# Patient Record
Sex: Female | Born: 1935 | Race: White | Hispanic: No | Smoking: Never smoker
Health system: Southern US, Community
[De-identification: ages and names within clinical notes are randomized; demographics above are authoritative.]

## PROBLEM LIST (undated history)

## (undated) DIAGNOSIS — F0390 Unspecified dementia without behavioral disturbance: Secondary | ICD-10-CM

## (undated) DIAGNOSIS — I10 Essential (primary) hypertension: Secondary | ICD-10-CM

## (undated) DIAGNOSIS — E079 Disorder of thyroid, unspecified: Secondary | ICD-10-CM

## (undated) DIAGNOSIS — E785 Hyperlipidemia, unspecified: Secondary | ICD-10-CM

## (undated) DIAGNOSIS — C801 Malignant (primary) neoplasm, unspecified: Secondary | ICD-10-CM

## (undated) DIAGNOSIS — R350 Frequency of micturition: Secondary | ICD-10-CM

## (undated) HISTORY — DX: Hyperlipidemia, unspecified: E78.5

## (undated) HISTORY — DX: Malignant (primary) neoplasm, unspecified: C80.1

## (undated) HISTORY — PX: BLADDER SURGERY: SHX569

## (undated) HISTORY — PX: BACK SURGERY: SHX140

---

## 2008-10-04 HISTORY — PX: OTHER SURGICAL HISTORY: SHX169

## 2014-07-18 ENCOUNTER — Encounter: Payer: Self-pay | Admitting: Emergency Medicine

## 2014-07-18 ENCOUNTER — Emergency Department (INDEPENDENT_AMBULATORY_CARE_PROVIDER_SITE_OTHER): Payer: 59

## 2014-07-18 ENCOUNTER — Emergency Department
Admission: EM | Admit: 2014-07-18 | Discharge: 2014-07-18 | Disposition: A | Discharge status: Home / Self Care | Payer: 59 | Source: Home / Self Care | Attending: Emergency Medicine | Admitting: Emergency Medicine

## 2014-07-18 DIAGNOSIS — M79605 Pain in left leg: Secondary | ICD-10-CM

## 2014-07-18 DIAGNOSIS — S20212A Contusion of left front wall of thorax, initial encounter: Secondary | ICD-10-CM

## 2014-07-18 DIAGNOSIS — R079 Chest pain, unspecified: Secondary | ICD-10-CM

## 2014-07-18 DIAGNOSIS — M25552 Pain in left hip: Secondary | ICD-10-CM

## 2014-07-18 DIAGNOSIS — S7002XA Contusion of left hip, initial encounter: Secondary | ICD-10-CM

## 2014-07-18 DIAGNOSIS — S7012XA Contusion of left thigh, initial encounter: Secondary | ICD-10-CM

## 2014-07-18 HISTORY — DX: Frequency of micturition: R35.0

## 2014-07-18 HISTORY — DX: Disorder of thyroid, unspecified: E07.9

## 2014-07-18 HISTORY — DX: Essential (primary) hypertension: I10

## 2014-07-18 MED ORDER — IBUPROFEN 200 MG PO TABS: ORAL_TABLET | ORAL | Status: DC

## 2014-07-18 NOTE — ED Notes (Signed)
On blood pressure medication but does not know name or amount.

## 2014-07-18 NOTE — ED Notes (Signed)
Reports tripping due to untied shoes in house yesterday; falling on floor; now has pain in upper left leg and left ribs that has not cleared with ibuprofen.

## 2014-07-18 NOTE — Discharge Instructions (Signed)
X-rays left hip and thigh and ribs-No fracture.  Make sure you have rales or handles installed in your house to help prevent falls. Make sure all carpets are attached to the floor so they don't slip   Rib Contusion A rib contusion (bruise) can occur by a blow to the chest or by a fall against a hard object. Usually these will be much better in a couple weeks. If X-rays were taken today and there are no broken bones (fractures), the diagnosis of bruising is made. However, broken ribs may not show up for several days, or may be discovered later on a routine X-ray when signs of healing show up. If this happens to you, it does not mean that something was missed on the X-ray, but simply that it did not show up on the first X-rays. Earlier diagnosis will not usually change the treatment. HOME CARE INSTRUCTIONS   Avoid strenuous activity. Be careful during activities and avoid bumping the injured ribs. Activities that pull on the injured ribs and cause pain should be avoided, if possible.  For the first day or two, an ice pack used every 20 minutes while awake may be helpful. Put ice in a plastic bag and put a towel between the bag and the skin.  Eat a normal, well-balanced diet. Drink plenty of fluids to avoid constipation.  Take deep breaths several times a day to keep lungs free of infection. Try to cough several times a day. Splint the injured area with a pillow while coughing to ease pain. Coughing can help prevent pneumonia.  Wear a rib belt or binder only if told to do so by your caregiver. If you are wearing a rib belt or binder, you must do the breathing exercises as directed by your caregiver. If not used properly, rib belts or binders restrict breathing which can lead to pneumonia.  Only take over-the-counter or prescription medicines for pain, discomfort, or fever as directed by your caregiver. SEEK MEDICAL CARE IF:   You or your child has an oral temperature above 102 F (38.9  C).  Your baby is older than 3 months with a rectal temperature of 100.5 F (38.1 C) or higher for more than 1 day.  You develop a cough, with thick or bloody sputum. SEEK IMMEDIATE MEDICAL CARE IF:   You have difficulty breathing.  You feel sick to your stomach (nausea), have vomiting or belly (abdominal) pain.  You have worsening pain, not controlled with medications, or there is a change in the location of the pain.  You develop sweating or radiation of the pain into the arms, jaw or shoulders, or become light headed or faint.   Hip Contusion  An iliac crest contusion is a deep bruise of your hip bone (hip pointer). Contusions happen when an injury causes bleeding under the skin. Signs of bruising include pain, puffiness (swelling), and discolored skin. The contusion may turn blue, purple, or yellow. HOME CARE   Put ice on the injured area.  Put ice in a plastic bag.  Place a towel between your skin and the bag.  Leave the ice on for 15-20 minutes, 03-04 times a day.  Only take medicines as told by your doctor.  Keep your leg straight (extended) when possible.  Walk and move around as pain allows, or as told by your doctor. Use crutches if you are told to do so.  Put on an elastic wrap as told by your doctor. You can take it off for  sleeping, showers, and baths. GET HELP RIGHT AWAY IF:  You have more bruising or puffiness.  You have pain that is getting worse.  Your puffiness or pain is not helped by medicines.  Your toes get cold. MAKE SURE YOU:   Understand these instructions.  Will watch your condition.  Will get help right away if you are not doing well or get worse. Document Released: 09/09/2011 Document Revised: 03/21/2012 Document Reviewed: 09/09/2011 [facility] Patient Information 2015 [place], [place]. This information is not intended to replace advice given to you by your health care provider. Make sure you discuss any questions you have with your health  care provider.  MAKE SURE YOU:   Understand these instructions.  Will watch your condition.  Will get help right away if you are not doing well or get worse. Document Released: 06/15/2001 Document Revised: 01/15/2013 Document Reviewed: 05/08/2008 [facility] Patient Information 2015 [place], [place]. This information is not intended to replace advice given to you by your health care provider. Make sure you discuss any questions you have with your health care provider.

## 2014-07-18 NOTE — ED Provider Notes (Signed)
CSN: [id number]     Arrival date & time 07/18/14  3810 History   First MD Initiated Contact with Patient 07/18/14 1009     Chief Complaint  Patient presents with  . Leg Pain  . Chest Pain    Patient is a 80 y.o. female presenting with leg pain and chest pain.  Leg Pain Location:  Hip Time since incident:  1 day Injury: yes   Mechanism of injury: fall   Hip location:  L hip Pain details:    Quality:  Sharp   Radiates to:  Does not radiate   Pain severity now: 7/10.   Progression:  Unchanged Chronicity:  New Prior injury to area:  No Associated symptoms: decreased ROM and stiffness   Associated symptoms: no back pain, no fever, no neck pain, no numbness, no swelling and no tingling   Risk factors: no frequent fractures   Chest Pain Associated symptoms: no back pain and no fever    daughter brings her in. Reports tripping due to untied shoes in house yesterday; falling on floor; now has pain in upper left leg/hip and left ribs. No loss of consciousness Ibuprofen helped the pain a little bit. Also has moderate pain left anterior lateral ribs. Denies breathing problems. No exertional chest pain or shortness of breath. No nausea or vomiting. Denies seizures, focal neurologic symptoms, syncope. Denies lightheadedness. Past Medical History  Diagnosis Date  . Hypertension   . Thyroid disease   . Frequency of urination    Past Surgical History  Procedure Laterality Date  . Back surgery     Family History  Problem Relation Age of Onset  . Diabetes Brother    History  Substance Use Topics  . Smoking status: Never Smoker   . Smokeless tobacco: Not on file  . Alcohol Use: No   OB History   Grav Para Term Preterm Abortions TAB SAB Ect Mult Living                 Review of Systems  Constitutional: Negative for fever.  Cardiovascular: Positive for chest pain.  Musculoskeletal: Positive for stiffness. Negative for back pain and neck pain.  All other systems reviewed and  are negative.   Allergies  Review of patient's allergies indicates no known allergies.  Home Medications   Prior to Admission medications   Medication Sig Start Date End Date Taking? Authorizing Provider  levothyroxine (SYNTHROID, LEVOTHROID) 75 MCG tablet Take 75 mcg by mouth daily before breakfast.   Yes Historical Provider, MD  solifenacin (VESICARE) 10 MG tablet Take by mouth daily.   Yes Historical Provider, MD  ibuprofen (ADVIL,MOTRIN) 200 MG tablet Take 2 tablets ( 400 milligrams total) every 6 with food as needed for pain. 07/18/14   Jacqulyn Cane, MD   BP 125/78  Pulse 82  Temp(Src) 98 F (36.7 C) (Oral)  Resp 16  Ht 5\' 6"  (1.676 m)  Wt 182 lb (82.555 kg)  BMI 29.39 kg/m2  SpO2 97% Physical Exam  Nursing note and vitals reviewed. Constitutional: She is oriented to person, place, and time. She appears well-developed and well-nourished. No distress.  In wheelchair. Alert, cooperative female here with daughter. She appears mildly uncomfortable from left rib pain and left hip and thigh pain at rest.-- moderate to severe pain with movement  HENT:  Head: Normocephalic and atraumatic.  Eyes: Conjunctivae and EOM are normal. Pupils are equal, round, and reactive to light. No scleral icterus.  Neck: Normal range of motion. No spinous  process tenderness and no muscular tenderness present.  Cardiovascular: Normal rate and normal heart sounds.   Pulmonary/Chest: Effort normal and breath sounds normal. She exhibits tenderness.  Chest: No deformity or ecchymosis. Exquisitely tender to palpation left anterior lateral ribs  Abdominal: She exhibits no distension. There is no tenderness.  Musculoskeletal: She exhibits no edema.       Left hip: She exhibits decreased range of motion, tenderness and bony tenderness. She exhibits normal strength, no swelling, no deformity and no laceration.       Left upper leg: She exhibits tenderness and bony tenderness. She exhibits no swelling, no edema,  no deformity and no laceration.  Neurological: She is alert and oriented to person, place, and time.  Skin: Skin is warm.  Psychiatric: She has a normal mood and affect.   Lower extremities: Neurovascular distally intact. Skin without ecchymosis or rash ED Course  Procedures (including critical care time) Labs Review Labs Reviewed - No data to display  Imaging Review Dg Ribs Unilateral W/chest Left  07/18/2014   CLINICAL DATA:  Status post fall, chest and rib pain  EXAM: LEFT RIBS AND CHEST - 3+ VIEW  COMPARISON:  None.  FINDINGS: No fracture or other bone lesions are seen involving the ribs. There is no evidence of pneumothorax or pleural effusion.  There is elevation of the left diaphragm. Both lungs are clear. Heart size and mediastinal contours are within normal limits. 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  IMPRESSION: No acute osseous injury of the ribs.   Electronically Signed   By: Kathreen Devoid   On: 07/18/2014 11:16   Dg Hip Complete Left  07/18/2014   CLINICAL DATA:  [place] yesterday with left hip and leg pain.  EXAM: LEFT HIP - COMPLETE 2+ VIEW; LEFT FEMUR - 2 VIEW  COMPARISON:  None.  FINDINGS: Both hips are normally located. Moderate to advanced degenerative changes bilaterally with joint space narrowing and osteophytic spurring. No acute fracture is identified. The pubic symphysis and SI joints are intact. No pelvic fractures. The femur is intact.  IMPRESSION: No acute bony findings.   Electronically Signed   By: Kalman Jewels M.D.   On: 07/18/2014 11:18   Dg Femur Left  07/18/2014   CLINICAL DATA:  [place] yesterday with left hip and leg pain.  EXAM: LEFT HIP - COMPLETE 2+ VIEW; LEFT FEMUR - 2 VIEW  COMPARISON:  None.  FINDINGS: Both hips are normally located. Moderate to advanced degenerative changes bilaterally with joint space narrowing and osteophytic spurring. No acute fracture is identified. The pubic symphysis and SI joints are intact. No pelvic fractures.  The femur is intact.  IMPRESSION: No acute bony findings.   Electronically Signed   By: Kalman Jewels M.D.   On: 07/18/2014 11:18     MDM   1. Contusion of left hip and thigh, initial encounter   2. Contusion of ribs, left, initial encounter    All x-rays negative. No acute bony abnormalities. Some degenerative changes. Treatment options discussed, as well as risks, benefits, alternatives. Patient voiced understanding and agreement with the following plans: Ice, elevation, other general measures discussed. See AVS for details. Preventative measures discussed to help prevent falls. See AVS. Advised to discuss this with her PCP. Ibuprofen OTC, 2 every 6 hours with food as needed for pain, as this has helped. She declined any other prescription pain medication. See detailed Instructions in AVS, which were given to patient. Verbal instructions also given. Risks, benefits, and  alternatives of treatment options discussed.   Followup with PCP or orthopedist in one week if not better, sooner if worse or new symptoms . Questions invited and answered. Patient and daughter voiced understanding and agreement with plans.     Jacqulyn Cane, MD 07/18/14 [phone]

## 2014-10-16 ENCOUNTER — Emergency Department
Admission: EM | Admit: 2014-10-16 | Discharge: 2014-10-16 | Disposition: A | Discharge status: Home / Self Care | Payer: Medicare Other | Source: Home / Self Care | Attending: Family Medicine | Admitting: Family Medicine

## 2014-10-16 ENCOUNTER — Encounter: Payer: Self-pay | Admitting: *Deleted

## 2014-10-16 ENCOUNTER — Emergency Department (INDEPENDENT_AMBULATORY_CARE_PROVIDER_SITE_OTHER): Payer: Medicare Other

## 2014-10-16 DIAGNOSIS — R0781 Pleurodynia: Secondary | ICD-10-CM

## 2014-10-16 DIAGNOSIS — J9811 Atelectasis: Secondary | ICD-10-CM

## 2014-10-16 MED ORDER — HYDROCODONE-ACETAMINOPHEN 5-325 MG PO TABS: ORAL_TABLET | ORAL | Status: DC

## 2014-10-16 NOTE — ED Notes (Signed)
Pt c/o cough x 2 wks. She reports seeing her PCP last week was given cough syrup and ABT. She now c/o chest and back pain. Denies fever or SOB. She also c/o LT ankle swelling without injury.

## 2014-10-16 NOTE — Discharge Instructions (Signed)
May continue ibuprofen daytime for pain.   Chest Wall Pain Chest wall pain is pain in or around the bones and muscles of your chest. It may take up to 6 weeks to get better. It may take longer if you must stay physically active in your work and activities.  CAUSES  Chest wall pain may happen on its own. However, it may be caused by:  A viral illness like the flu.  Injury.  Coughing.  Exercise.  Arthritis.  Fibromyalgia.  Shingles. HOME CARE INSTRUCTIONS   Avoid overtiring physical activity. Try not to strain or perform activities that cause pain. This includes any activities using your chest or your abdominal and side muscles, especially if heavy weights are used.  Put ice on the sore area.  Put ice in a plastic bag.  Place a towel between your skin and the bag.  Leave the ice on for 15-20 minutes per hour while awake for the first 2 days.  Only take over-the-counter or prescription medicines for pain, discomfort, or fever as directed by your caregiver. SEEK IMMEDIATE MEDICAL CARE IF:   Your pain increases, or you are very uncomfortable.  You have a fever.  Your chest pain becomes worse.  You have new, unexplained symptoms.  You have nausea or vomiting.  You feel sweaty or lightheaded.  You have a cough with phlegm (sputum), or you cough up blood. MAKE SURE YOU:   Understand these instructions.  Will watch your condition.  Will get help right away if you are not doing well or get worse. Document Released: 09/20/2005 Document Revised: 12/13/2011 Document Reviewed: 05/17/2011 [facility] Patient Information 2015 [place], [place]. This information is not intended to replace advice given to you by your health care provider. Make sure you discuss any questions you have with your health care provider.

## 2014-10-16 NOTE — ED Provider Notes (Signed)
CSN: [id number]     Arrival date & time 10/16/14  1744 History   First MD Initiated Contact with Patient 10/16/14 1803     Chief Complaint  Patient presents with  . Chest Pain      HPI Comments: Patient had a cold about 3 weeks ago.  She visited her PCP one week ago who prescribed an antibiotic and cough syrup.  She generally improved but now has developed pleuritic pain lower anterior chest during the past week.     The history is provided by the patient and a relative.    Past Medical History  Diagnosis Date  . Hypertension   . Thyroid disease   . Frequency of urination    Past Surgical History  Procedure Laterality Date  . Back surgery     Family History  Problem Relation Age of Onset  . Diabetes Brother    History  Substance Use Topics  . Smoking status: Never Smoker   . Smokeless tobacco: Not on file  . Alcohol Use: No   OB History    No data available     Review of Systems No sore throat + cough + pleuritic pain No wheezing No nasal congestion No post-nasal drainage No sinus pain/pressure No itchy/red eyes No earache No hemoptysis No SOB No fever/chills No nausea No vomiting No abdominal pain No diarrhea No urinary symptoms No skin rash No fatigue No myalgias No headache    Allergies  Review of patient's allergies indicates no known allergies.  Home Medications   Prior to Admission medications   Medication Sig Start Date End Date Taking? Authorizing Provider  HYDROcodone-acetaminophen (NORCO/VICODIN) 5-325 MG per tablet Take one-half to one tab by mouth at bedtime as needed for pain 10/16/14   Kandra Nicolas, MD  ibuprofen (ADVIL,MOTRIN) 200 MG tablet Take 2 tablets ( 400 milligrams total) every 6 with food as needed for pain. 07/18/14   Jacqulyn Cane, MD  levothyroxine (SYNTHROID, LEVOTHROID) 75 MCG tablet Take 75 mcg by mouth daily before breakfast.    Historical Provider, MD  nebivolol (BYSTOLIC) 5 MG tablet Take 5 mg by mouth daily.     Historical Provider, MD  solifenacin (VESICARE) 10 MG tablet Take by mouth daily.    Historical Provider, MD   BP 146/85 mmHg  Pulse 78  Temp(Src) 97.6 F (36.4 C) (Oral)  Resp 18  Ht 5\' 7"  (1.702 m)  Wt 190 lb (86.183 kg)  BMI 29.75 kg/m2  SpO2 95% Physical Exam  Constitutional: She is oriented to person, place, and time. She appears well-developed and well-nourished. No distress.  HENT:  Head: Normocephalic.  Right Ear: Tympanic membrane normal.  Left Ear: Tympanic membrane normal.  Nose: Nose normal.  Mouth/Throat: Oropharynx is clear and moist.  Eyes: Conjunctivae are normal. Pupils are equal, round, and reactive to light.  Neck: Neck supple.  Cardiovascular: Normal rate and normal heart sounds.   Pulmonary/Chest: Breath sounds normal. No respiratory distress. She has no wheezes. She has no rales. She exhibits no tenderness.    Patient complains of pain in areas of anterior chest as noted on diagram.    Abdominal: There is no tenderness.  Lymphadenopathy:    She has no cervical adenopathy.  Neurological: She is alert and oriented to person, place, and time.  Skin: Skin is warm and dry. No rash noted.  Nursing note and vitals reviewed.   ED Course  Procedures  none    Imaging Review Dg Ribs Bilateral W/chest  10/16/2014   CLINICAL DATA:  Cough, BILATERAL lower rib pain for 3 weeks LEFT greater than RIGHT, history hypertension  EXAM: BILATERAL RIBS AND CHEST - 4+ VIEW  COMPARISON:  Chest radiographs 07/18/2014  FINDINGS: Slightly increased elevation of LEFT diaphragm.  Upper normal heart size.  Tortuous aorta.  Pulmonary vascularity normal.  Bibasilar atelectasis.  No infiltrate, pleural effusion or pneumothorax.  Bones demineralized.  Within limitations of technique, no definite rib fracture or bone destruction identified.  IMPRESSION: Bibasilar atelectasis without visualized rib abnormalities.   Electronically Signed   By: Lavonia Dana M.D.   On: 10/16/2014 18:49      MDM   1. Rib pain on left side   2. Rib pain on right side    Rib belt applied. Patient reports significant decrease in pain after applying rib belt. Discussed precautions for wearing a rib belt. Rx for Lortab at night prn. May continue ibuprofen daytime for pain. If symptoms become significantly worse during the night or over the weekend, proceed to the local emergency room.  Followup with Family Doctor if not improved in one week.     Kandra Nicolas, MD 10/20/14 [phone]

## 2015-03-14 ENCOUNTER — Encounter: Payer: Self-pay | Admitting: Emergency Medicine

## 2015-03-14 ENCOUNTER — Emergency Department
Admission: EM | Admit: 2015-03-14 | Discharge: 2015-03-14 | Disposition: A | Discharge status: Home / Self Care | Payer: Medicare Other | Source: Home / Self Care | Attending: Family Medicine | Admitting: Family Medicine

## 2015-03-14 ENCOUNTER — Emergency Department (INDEPENDENT_AMBULATORY_CARE_PROVIDER_SITE_OTHER): Payer: Medicare Other

## 2015-03-14 DIAGNOSIS — X58XXXA Exposure to other specified factors, initial encounter: Secondary | ICD-10-CM | POA: Diagnosis not present

## 2015-03-14 DIAGNOSIS — S2231XA Fracture of one rib, right side, initial encounter for closed fracture: Secondary | ICD-10-CM

## 2015-03-14 MED ORDER — HYDROCODONE-ACETAMINOPHEN 5-325 MG PO TABS: ORAL_TABLET | ORAL | Status: DC

## 2015-03-14 NOTE — Discharge Instructions (Signed)
Continue to apply ice pack two or three times daily.  May take Tylenol daytime as needed for pain. If symptoms become significantly worse during the night or over the weekend, proceed to the local emergency room.    Rib Fracture A rib fracture is a break or crack in one of the bones of the ribs. The ribs are a group of long, curved bones that wrap around your chest and attach to your spine. They protect your lungs and other organs in the chest cavity. A broken or cracked rib is often painful, but most do not cause other problems. Most rib fractures heal on their own over time. However, rib fractures can be more serious if multiple ribs are broken or if broken ribs move out of place and push against other structures. CAUSES   A direct blow to the chest. For example, this could happen during contact sports, a car accident, or a fall against a hard object.  Repetitive movements with high force, such as pitching a baseball or having severe coughing spells. SYMPTOMS   Pain when you breathe in or cough.  Pain when someone presses on the injured area. DIAGNOSIS  Your caregiver will perform a physical exam. Various imaging tests may be ordered to confirm the diagnosis and to look for related injuries. These tests may include a chest X-ray, computed tomography (CT), magnetic resonance imaging (MRI), or a bone scan. TREATMENT  Rib fractures usually heal on their own in 1-3 months. The longer healing period is often associated with a continued cough or other aggravating activities. During the healing period, pain control is very important. Medication is usually given to control pain. Hospitalization or surgery may be needed for more severe injuries, such as those in which multiple ribs are broken or the ribs have moved out of place.  HOME CARE INSTRUCTIONS   Avoid strenuous activity and any activities or movements that cause pain. Be careful during activities and avoid bumping the injured  rib.  Gradually increase activity as directed by your caregiver.  Only take over-the-counter or prescription medications as directed by your caregiver. Do not take other medications without asking your caregiver first.  Apply ice to the injured area for the first 1-2 days after you have been treated or as directed by your caregiver. Applying ice helps to reduce inflammation and pain.  Put ice in a plastic bag.  Place a towel between your skin and the bag.   Leave the ice on for 15-20 minutes at a time, every 2 hours while you are awake.  Perform deep breathing as directed by your caregiver. This will help prevent pneumonia, which is a common complication of a broken rib. Your caregiver may instruct you to:  Take deep breaths several times a day.  Try to cough several times a day, holding a pillow against the injured area.  Use a device called an incentive spirometer to practice deep breathing several times a day.  Drink enough fluids to keep your urine clear or pale yellow. This will help you avoid constipation.   Do not wear a rib belt or binder. These restrict breathing, which can lead to pneumonia.  SEEK IMMEDIATE MEDICAL CARE IF:   You have a fever.   You have difficulty breathing or shortness of breath.   You develop a continual cough, or you cough up thick or bloody sputum.  You feel sick to your stomach (nausea), throw up (vomit), or have abdominal pain.   You have worsening pain  not controlled with medications.  MAKE SURE YOU:  Understand these instructions.  Will watch your condition.  Will get help right away if you are not doing well or get worse. Document Released: 09/20/2005 Document Revised: 05/23/2013 Document Reviewed: 11/22/2012 [facility] Patient Information 2015 [place], [place]. This information is not intended to replace advice given to you by your health care provider. Make sure you discuss any questions you have with your health care  provider.
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
## 2015-03-17 ENCOUNTER — Telehealth: Payer: Self-pay | Admitting: Emergency Medicine

## 2015-03-25 ENCOUNTER — Emergency Department
Admission: EM | Admit: 2015-03-25 | Discharge: 2015-03-25 | Disposition: A | Discharge status: Home / Self Care | Payer: Medicare Other | Source: Home / Self Care | Attending: Emergency Medicine | Admitting: Emergency Medicine

## 2015-03-25 ENCOUNTER — Emergency Department (INDEPENDENT_AMBULATORY_CARE_PROVIDER_SITE_OTHER): Payer: Medicare Other

## 2015-03-25 ENCOUNTER — Encounter: Payer: Self-pay | Admitting: *Deleted

## 2015-03-25 DIAGNOSIS — M25472 Effusion, left ankle: Secondary | ICD-10-CM | POA: Diagnosis not present

## 2015-03-25 DIAGNOSIS — S8012XA Contusion of left lower leg, initial encounter: Secondary | ICD-10-CM | POA: Diagnosis not present

## 2015-03-25 DIAGNOSIS — S93402A Sprain of unspecified ligament of left ankle, initial encounter: Secondary | ICD-10-CM | POA: Diagnosis not present

## 2015-03-25 DIAGNOSIS — M11262 Other chondrocalcinosis, left knee: Secondary | ICD-10-CM

## 2015-03-25 NOTE — ED Notes (Signed)
Pt c/o LT lower leg swelling and "feels funny" post fall on 03/14/15. Denies pain.

## 2015-03-25 NOTE — ED Provider Notes (Signed)
CSN: [id number]     Arrival date & time 03/25/15  [facility] History   First MD Initiated Contact with Patient 03/25/15 1225     Chief Complaint  Patient presents with  . Leg Swelling    HPI Complains of moderate intensity dull and sharp left lateral ankle pain and swelling and moderate intensity sharp left anterior tibial pain, ever since she fell 03/14/2015. It hurts to weight-bear but she is able to weight-bear. She denies any calf swelling or calf pain. She states that she didn't complain of the left leg pain at that time because she had more severe pain in right ribs. She was seen here at [facility] by Dr. Assunta Found on 03/14/2015, right rib x-rays show a nondisplaced right ninth rib fracture. The rib pain is slowly improving, she is taking one half Vicodin at bedtime which helps that pain. Denies shortness of breath or any anterior chest pain. She saw her PCP for recheck last week, and has an appointment for recheck with her PCP in 3 days. Denies fever or chills or nausea or vomiting. Denies syncope or lightheadedness or focal neurologic symptoms. Past Medical History  Diagnosis Date  . Hypertension   . Thyroid disease   . Frequency of urination    Past Surgical History  Procedure Laterality Date  . Back surgery     Family History  Problem Relation Age of Onset  . Diabetes Brother    History  Substance Use Topics  . Smoking status: Never Smoker   . Smokeless tobacco: Not on file  . Alcohol Use: No   OB History    No data available     Review of Systems  All other systems reviewed and are negative. Remainder of Review of Systems negative for acute change except as noted in the HPI.   Allergies  Review of patient's allergies indicates no known allergies.  Home Medications   Prior to Admission medications   Medication Sig Start Date End Date Taking? Authorizing Provider  HYDROcodone-acetaminophen (NORCO/VICODIN) 5-325 MG per tablet Take  one-half to one tab by mouth at bedtime as needed for pain 03/14/15   Kandra Nicolas, MD  ibuprofen (ADVIL,MOTRIN) 200 MG tablet Take 2 tablets ( 400 milligrams total) every 6 with food as needed for pain. 07/18/14   Jacqulyn Cane, MD  levothyroxine (SYNTHROID, LEVOTHROID) 75 MCG tablet Take 75 mcg by mouth daily before breakfast.    Historical Provider, MD  nebivolol (BYSTOLIC) 5 MG tablet Take 5 mg by mouth daily.    Historical Provider, MD  solifenacin (VESICARE) 10 MG tablet Take by mouth daily.    Historical Provider, MD   BP 185/73 mmHg  Pulse 60  Temp(Src) 97.7 F (36.5 C) (Oral)  Resp 18  Ht 5\' 6"  (1.676 m)  Wt 182 lb (82.555 kg)  BMI 29.39 kg/m2  SpO2 95% Physical Exam  Constitutional: She is oriented to person, place, and time. She appears well-developed and well-nourished. No distress.  HENT:  Head: Normocephalic and atraumatic.  Mouth/Throat: Oropharynx is clear and moist.  Eyes: Conjunctivae and EOM are normal. Pupils are equal, round, and reactive to light. No scleral icterus.  Neck: Normal range of motion. No JVD present.  Cardiovascular: Normal rate.   Pulmonary/Chest: Effort normal. No respiratory distress.  Abdominal: She exhibits no distension.  Musculoskeletal:       Left ankle: She exhibits decreased range of motion. She exhibits no deformity, no laceration and normal pulse. Tenderness. Lateral  malleolus (With swelling) tenderness found. No head of 5th metatarsal tenderness found. Achilles tendon normal.       Left lower leg: She exhibits tenderness (Mid anterior tibial area.) and bony tenderness. She exhibits no edema.       Left foot: Normal.  Left leg without any calf tenderness. No cords or heat. No calf swelling. Negative Homans sign Neurovascular distally intact left lower extremity  Neurological: She is alert and oriented to person, place, and time.  Skin: Skin is warm.  Psychiatric: She has a normal mood and affect.  Nursing note and vitals  reviewed.   ED Course  Procedures (including critical care time) Labs Review Labs Reviewed - No data to display  Imaging Review Dg Tibia/fibula Left  03/25/2015   CLINICAL DATA:  81 year old female with severe left lower extremity pain and swelling. Fall last week. Initial encounter.  EXAM: LEFT TIBIA AND FIBULA - 2 VIEW  COMPARISON:  Left ankle series from today reported separately. Left femur series 1015 2015  FINDINGS: Preserved alignment at the left knee. Chondrocalcinosis greater in the lateral compartment. Calcified popliteal atherosclerosis. Left tibia and fibula appear intact. No subcutaneous gas.  IMPRESSION: 1.  No acute osseous abnormality identified about the left tib-fib. 2. Left knee Chondrocalcinosis which can be seen in the setting of calcium pyrophosphate deposition disease.   Electronically Signed   By: Genevie Ann M.D.   On: 03/25/2015 13:41   Dg Ankle Complete Left  03/25/2015   CLINICAL DATA:  81 year old female with severe left lower extremity pain and swelling. Fall last week. Initial encounter.  EXAM: LEFT ANKLE COMPLETE - 3+ VIEW  COMPARISON:  None.  FINDINGS: Osteopenia. Mortise joint alignment preserved. Talar dome intact. Calcaneus intact. Chronic appearing mild cortical irregularity at the lateral malleolus. Diffuse soft tissue swelling about the ankle. No joint effusion identified. No subcutaneous gas.  IMPRESSION: Soft tissue swelling. No acute osseous abnormality identified at the left ankle.   Electronically Signed   By: Genevie Ann M.D.   On: 03/25/2015 13:40   Reviewed with her that x-rays left ankle and left tib-fib show no acute or subacute fracture, but there is soft tissue swelling lateral malleolus.  MDM   1. Left ankle sprain, initial encounter   2. Contusion of left leg, initial encounter    This likely was from her fall on 03/14/2015. She also has a diagnosis of nondisplaced right ninth rib fracture, which is still painful at times and is less painful than  before, and I advised her that it may take many weeks for the pain to improve further. Clinically no evidence of DVT. No evidence of any acute cardiorespiratory compromise. Pulse ox 95% on room air, repeated 96% on room air. Treatment options discussed, as well as risks, benefits, alternatives. Patient voiced understanding and agreement with the following plans: May use the Vicodin that was previously prescribed, but uses sparingly and precautions discussed. Otherwise Tylenol or ibuprofen for mild or moderate pain. Left ankle ASO brace. Other symptomatic care discussed. See detailed Instructions in AVS, which were given to patient. Verbal instructions also given. Risks, benefits, and alternatives of treatment options discussed. Questions invited and answered. Patient voiced understanding and agreement with plans. I reviewed the details in AVS with her, as well as gave her copy of all x-rays from 6/10 and today, advised her to bring all of this printed material with her to her PCP follow-up appointment in 3 days. Precautions discussed. Red flags discussed. Questions invited and answered.  Patient voiced understanding and agreement.  Over 30 minutes spent, greater than 50% of the time spent for counseling and coordination of care.      Jacqulyn Cane, MD 03/25/15 2119

## 2015-03-25 NOTE — Discharge Instructions (Signed)
Ankle Sprain An ankle sprain is an injury to the strong, fibrous tissues (ligaments) that hold your ankle bones together.  Today, x-ray left ankle and left leg showed no fracture or dislocation. HOME CARE   Put ice on your ankle for 1-2 days or as told by your doctor.  Put ice in a plastic bag.  Place a towel between your skin and the bag.  Leave the ice on for 15-20 minutes at a time, every 2 hours while you are awake.  Only take medicine as told by your doctor.  Raise (elevate) your injured ankle above the level of your heart as much as possible for 2-3 days.  Use crutches if your doctor tells you to. Slowly put your own weight on the affected ankle. Use the crutches until you can walk without pain.  Take it off to shower or bathe.  Use the left ankle brace that we provided, for support. Take  off if your toes lose feeling (numb), tingle, or turn cold or blue.  If you have an air splint:  Add or let out air to make it comfortable.  Take it off at night and to shower and bathe.  Wiggle your toes and move your ankle up and down often while you are wearing it. GET HELP IF:  You have rapidly increasing bruising or puffiness (swelling).  Your toes feel very cold.  You lose feeling in your foot.  Your medicine does not help your pain. GET HELP RIGHT AWAY IF:   Your toes lose feeling (numb) or turn blue.  You have severe pain that is increasing. MAKE SURE YOU:   Understand these instructions.  Will watch your condition.  Will get help right away if you are not doing well or get worse. Document Released: 03/08/2008 Document Revised: 02/04/2014 Document Reviewed: 04/03/2012 [facility] Patient Information 2015 [place], [place]. This information is not intended to replace advice given to you by your health care provider. Make sure you discuss any questions you have with your health care provider.  On June 10, you were seen in urgent care for rib fracture and other  contusions. Continue pain meds that was prescribed at that time.  We are giving you printouts of x-ray reports from June 10 and June 21, here at [facility]. Keep follow-up appointment with your primary doctor  on 03/28/2015

## 2016-11-15 ENCOUNTER — Telehealth: Payer: Self-pay | Admitting: Physician Assistant

## 2016-11-15 ENCOUNTER — Ambulatory Visit (INDEPENDENT_AMBULATORY_CARE_PROVIDER_SITE_OTHER): Payer: Medicare Other | Admitting: Physician Assistant

## 2016-11-15 ENCOUNTER — Encounter: Payer: Self-pay | Admitting: Physician Assistant

## 2016-11-15 VITALS — BP 103/62 | HR 65 | Ht 63.5 in | Wt 175.4 lb

## 2016-11-15 DIAGNOSIS — W19XXXD Unspecified fall, subsequent encounter: Secondary | ICD-10-CM | POA: Diagnosis not present

## 2016-11-15 DIAGNOSIS — M545 Low back pain, unspecified: Secondary | ICD-10-CM

## 2016-11-15 DIAGNOSIS — Z7409 Other reduced mobility: Secondary | ICD-10-CM

## 2016-11-15 DIAGNOSIS — B379 Candidiasis, unspecified: Secondary | ICD-10-CM

## 2016-11-15 DIAGNOSIS — M25551 Pain in right hip: Secondary | ICD-10-CM

## 2016-11-15 DIAGNOSIS — R6889 Other general symptoms and signs: Secondary | ICD-10-CM

## 2016-11-15 MED ORDER — NYSTATIN 100000 UNIT/GM EX OINT: 1.0000 "application " | TOPICAL_OINTMENT | Freq: Two times a day (BID) | CUTANEOUS | 1 refills | Status: DC

## 2016-11-15 MED ORDER — TRAMADOL HCL 50 MG PO TABS: 50.0000 mg | ORAL_TABLET | Freq: Three times a day (TID) | ORAL | 0 refills | Status: DC | PRN

## 2016-11-15 NOTE — Telephone Encounter (Signed)
Pts daughter - Anderson Malta said she had talk to [facility] about getting a sitter or CNA services set up for her mother and patient is requesting Luvenia Starch put this referral in asap because [facility] Solders told her they could not do it. Please contact Anderson Malta- pts daughter IF you need more info.  Thanks

## 2016-11-15 NOTE — Progress Notes (Signed)
   Subjective:    Patient ID: Madison Rollins, female    DOB: 1928/05/11, 83 y.o.   MRN: [id number]  HPI  Pt is a 83 yo female who presents to the clinic to establish care.   Pt had a fall on 11/02/16 and went to ED on 11/04/16 after bending over to pick something up and losing her balance on the way up. She cannot remember exactly the way she fell but she hurts more on her right hip/buttocks area.  CT of head with no acute abnormality.  CT of cervical spine with no acute abnormality.  Ct of abdomen no acute changes.  Xray of pelvis and hip with no acute fractures but with significant degeneration.  Pt was discharged with norco for pain.  She is not taking norco because she feels like it is too strong. She continues to be in pain. She is now walking with a walker for stablillity. She is having problems with ROM due to pain. She having problems lifting leg and moving around without assistance. She is not able to take a bath by herself. Pt's daughter having to stay with her and she is really needing some assistance.   Review of Systems See HPI.     Objective:   Physical Exam  Constitutional: She is oriented to person, place, and time. She appears well-developed and well-nourished.  HENT:  Head: Normocephalic and atraumatic.  Eyes: Conjunctivae are normal.  Neck: Normal range of motion. Neck supple.  Cardiovascular: Normal rate, regular rhythm and normal heart sounds.   Pulmonary/Chest: Effort normal and breath sounds normal.  Musculoskeletal:  Tenderness to palpation over right buttocks over SI.  ROM at waist, bilateral legs limited due to pain.  No bruising over right buttocks/hip.    Neurological: She is alert and oriented to person, place, and time.  Skin:  Macerated erythematous area at the top gluteal crease.   Psychiatric: She has a normal mood and affect. Her behavior is normal.          Assessment & Plan:  Madison KitchenMarland KitchenArthelia was seen today for establish care.  Diagnoses and all orders  for this visit:  Fall, subsequent encounter -     traMADol (ULTRAM) 50 MG tablet; Take 1 tablet (50 mg total) by mouth every 8 (eight) hours as needed. -     Ambulatory referral to Home Health  Right hip pain -     traMADol (ULTRAM) 50 MG tablet; Take 1 tablet (50 mg total) by mouth every 8 (eight) hours as needed. -     Ambulatory referral to [facility] right-sided low back pain without sciatica -     traMADol (ULTRAM) 50 MG tablet; Take 1 tablet (50 mg total) by mouth every 8 (eight) hours as needed. -     Ambulatory referral to Home Health  Candida infection -     nystatin ointment (MYCOSTATIN); Apply 1 application topically 2 (two) times daily.  Decreased ambulation status -     Ambulatory referral to Home Health   Pt needs to be evaluated for nursing assistance, physical therapy in the home, and balance testing.  Tramadol to replace norco.  Heat/ice/biofreeze

## 2016-11-16 DIAGNOSIS — W19XXXA Unspecified fall, initial encounter: Secondary | ICD-10-CM | POA: Insufficient documentation

## 2016-11-16 DIAGNOSIS — M25551 Pain in right hip: Secondary | ICD-10-CM | POA: Insufficient documentation

## 2016-11-16 DIAGNOSIS — M545 Low back pain, unspecified: Secondary | ICD-10-CM | POA: Insufficient documentation

## 2016-11-17 ENCOUNTER — Other Ambulatory Visit: Payer: Self-pay | Admitting: Physician Assistant

## 2016-11-17 DIAGNOSIS — M545 Low back pain, unspecified: Secondary | ICD-10-CM

## 2016-11-17 DIAGNOSIS — M25551 Pain in right hip: Secondary | ICD-10-CM

## 2016-11-17 DIAGNOSIS — W19XXXA Unspecified fall, initial encounter: Secondary | ICD-10-CM

## 2016-11-17 NOTE — Telephone Encounter (Signed)
Referral place for home health evaluation for potential physical therapy/nursing care/balance testing.

## 2016-11-23 ENCOUNTER — Telehealth: Payer: Self-pay

## 2016-11-23 NOTE — Telephone Encounter (Signed)
Occupational Therapy called wanting a verbal order for pt for an occupational therapy evaluation. Talked to [facility] PA-C and she verbalized approval.

## 2016-12-01 ENCOUNTER — Encounter: Payer: Self-pay | Admitting: Physician Assistant

## 2016-12-01 ENCOUNTER — Ambulatory Visit (INDEPENDENT_AMBULATORY_CARE_PROVIDER_SITE_OTHER): Payer: Medicare Other | Admitting: Physician Assistant

## 2016-12-01 ENCOUNTER — Encounter: Payer: Self-pay | Admitting: Gastroenterology

## 2016-12-01 ENCOUNTER — Other Ambulatory Visit: Payer: Self-pay | Admitting: *Deleted

## 2016-12-01 ENCOUNTER — Ambulatory Visit (INDEPENDENT_AMBULATORY_CARE_PROVIDER_SITE_OTHER): Payer: Medicare Other

## 2016-12-01 VITALS — BP 146/81 | HR 59 | Ht 63.5 in | Wt 171.0 lb

## 2016-12-01 DIAGNOSIS — B372 Candidiasis of skin and nail: Secondary | ICD-10-CM

## 2016-12-01 DIAGNOSIS — J9811 Atelectasis: Secondary | ICD-10-CM

## 2016-12-01 DIAGNOSIS — J90 Pleural effusion, not elsewhere classified: Secondary | ICD-10-CM | POA: Diagnosis not present

## 2016-12-01 DIAGNOSIS — M4854XA Collapsed vertebra, not elsewhere classified, thoracic region, initial encounter for fracture: Secondary | ICD-10-CM | POA: Diagnosis not present

## 2016-12-01 DIAGNOSIS — R0989 Other specified symptoms and signs involving the circulatory and respiratory systems: Secondary | ICD-10-CM | POA: Diagnosis not present

## 2016-12-01 DIAGNOSIS — R1310 Dysphagia, unspecified: Secondary | ICD-10-CM | POA: Diagnosis not present

## 2016-12-01 DIAGNOSIS — R1030 Lower abdominal pain, unspecified: Secondary | ICD-10-CM

## 2016-12-01 DIAGNOSIS — N3001 Acute cystitis with hematuria: Secondary | ICD-10-CM | POA: Diagnosis not present

## 2016-12-01 DIAGNOSIS — R05 Cough: Secondary | ICD-10-CM | POA: Diagnosis not present

## 2016-12-01 DIAGNOSIS — R059 Cough, unspecified: Secondary | ICD-10-CM

## 2016-12-01 LAB — COMPLETE METABOLIC PANEL WITH GFR
ALT: 10 U/L (ref 6–29)
AST: 15 U/L (ref 10–35)
Albumin: 4.1 g/dL (ref 3.6–5.1)
Alkaline Phosphatase: 99 U/L (ref 33–130)
BUN: 13 mg/dL (ref 7–25)
CO2: 22 mmol/L (ref 20–31)
Calcium: 9.9 mg/dL (ref 8.6–10.4)
Chloride: 103 mmol/L (ref 98–110)
Creat: 0.9 mg/dL — ABNORMAL HIGH (ref 0.60–0.88)
GFR, EST NON AFRICAN AMERICAN: 58 mL/min — AB (ref >=60)
GFR, Est African American: 67 mL/min (ref >=60)
GLUCOSE: 91 mg/dL (ref 65–99)
POTASSIUM: 4.3 mmol/L (ref 3.5–5.3)
SODIUM: 139 mmol/L (ref 135–146)
TOTAL PROTEIN: 6.7 g/dL (ref 6.1–8.1)
Total Bilirubin: 0.7 mg/dL (ref 0.2–1.2)

## 2016-12-01 LAB — POCT URINALYSIS DIPSTICK
BILIRUBIN UA: NEGATIVE
GLUCOSE UA: NEGATIVE
Ketones, UA: NEGATIVE
NITRITE UA: POSITIVE
Protein, UA: NEGATIVE
Spec Grav, UA: 1.02
Urobilinogen, UA: 0.2
pH, UA: 6

## 2016-12-01 LAB — CBC WITH DIFFERENTIAL/PLATELET
BASOS ABS: 0 {cells}/uL (ref 0–200)
Basophils Relative: 0 %
EOS PCT: 4 %
Eosinophils Absolute: 276 cells/uL (ref 15–500)
HCT: 44.1 % (ref 35.0–45.0)
Hemoglobin: 14.7 g/dL (ref 11.7–15.5)
Lymphocytes Relative: 30 %
Lymphs Abs: 2070 cells/uL (ref 850–3900)
MCH: 30.4 pg (ref 27.0–33.0)
MCHC: 33.3 g/dL (ref 32.0–36.0)
MCV: 91.3 fL (ref 80.0–100.0)
MPV: 11.4 fL (ref 7.5–12.5)
Monocytes Absolute: 552 cells/uL (ref 200–950)
Monocytes Relative: 8 %
Neutro Abs: 4002 cells/uL (ref 1500–7800)
Neutrophils Relative %: 58 %
PLATELETS: 211 10*3/uL (ref 140–400)
RBC: 4.83 MIL/uL (ref 3.80–5.10)
RDW: 13.9 % (ref 11.0–15.0)
WBC: 6.9 10*3/uL (ref 3.8–10.8)

## 2016-12-01 MED ORDER — CIPROFLOXACIN HCL 500 MG PO TABS: 500.0000 mg | ORAL_TABLET | Freq: Two times a day (BID) | ORAL | 0 refills | Status: DC

## 2016-12-01 MED ORDER — BENZONATATE 100 MG PO CAPS
100.0000 mg | ORAL_CAPSULE | Freq: Two times a day (BID) | ORAL | 0 refills | Status: DC | PRN
Start: 2016-12-01 — End: 2016-12-08

## 2016-12-01 MED ORDER — FUROSEMIDE 40 MG PO TABS: 40.0000 mg | ORAL_TABLET | Freq: Every day | ORAL | 0 refills | Status: DC

## 2016-12-01 NOTE — Progress Notes (Signed)
Call pt: she does have a small pleural effusion fluid build up in lungs. Does not appear to be infectious. You appear stable in office. I would like for patient to take 40mg  of lasix daily for one week then check up in office to make sure clearing. This will cause her to urinate more frequently.   Please send lasix 40mg  once daily #30 NRF

## 2016-12-01 NOTE — Progress Notes (Addendum)
Subjective:     Patient ID: Madison Rollins, female   DOB: 1928-07-27, 83 y.o.   MRN: [id number]  HPI  This is an 83 year old female who presents for several medical complaints.  She complains of lower right abdominal pain which started 3 weeks ago after her fall. She was evaluated with a CT scan which was unremarkable. Describes the pain as dull and achy and does not go anywhere. Ibuprofen 200 mg and laying helps with the pain. Pain exacerbated by movement. Endorses recent constipation but has since resolved with Magnesium citrate. No diarrhea, nausea, vomiting. Denies urinary symptoms.   Also relays choking while eating. Does not occur with any food in particular;however, cornbread is often involved. Patient attributes it to not chewing her food well and thinks she eats it too quickly however her daughter at bedside is not convinced. States she feels as though the food gets stuck in the middle of her throat. PT recommended barium swallow.  She also complains of wheezing for several weeks and a somewhat productive cough for a few months. She was given Tessalon pearls which helped her cough but she has since run out of them. Endorses SOB, dyspnea on exertion, lower extremity swelling. Denies chest pain, palpitations.  Review of Systems All other ROS negative except those noted in the HPI.     Objective:   Physical Exam  Constitutional: She is oriented to person, place, and time. She appears well-developed and well-nourished.  HENT:  Head: Normocephalic and atraumatic.  Neck: Normal range of motion. Neck supple. No thyromegaly present.  Cardiovascular: Normal rate, regular rhythm and normal heart sounds.   Pulmonary/Chest: Effort normal and breath sounds normal.  Crackles heard over the left lower lobe.  Abdominal: Soft. Bowel sounds are normal. There is tenderness. There is no guarding.    Musculoskeletal: Normal range of motion.  Mild lower extremity edema noted.  Lymphadenopathy:    She  has no cervical adenopathy.  Neurological: She is alert and oriented to person, place, and time.  Skin: Rash noted.  See above.  Psychiatric: She has a normal mood and affect. Her behavior is normal.       Assessment:    Diagnoses and all orders for this visit:  Pleural effusion on left  Cough -     DG Chest 2 View -     benzonatate (TESSALON) 100 MG capsule; Take 1 capsule (100 mg total) by mouth 2 (two) times daily as needed for cough.  Respiratory crackles at left lung base -     DG Chest 2 View -     CBC with Differential/Platelet  Lower abdominal pain -     CBC with Differential/Platelet -     COMPLETE METABOLIC PANEL WITH GFR -     ciprofloxacin (CIPRO) 500 MG tablet; Take 1 tablet (500 mg total) by mouth 2 (two) times daily. For 7 days. -     POCT urinalysis dipstick  Acute cystitis with hematuria -     COMPLETE METABOLIC PANEL WITH GFR -     ciprofloxacin (CIPRO) 500 MG tablet; Take 1 tablet (500 mg total) by mouth 2 (two) times daily. For 7 days. -     POCT urinalysis dipstick  Candidal intertrigo  Dysphagia, unspecified type -     Ambulatory referral to Gastroenterology  - Refilled Benzonatate for persistent cough which patient states works well for her. Given crackles auscultated over lower lung base and persistent cough CXR ordered today to rule out pneumonia  or other lung pathology. CXR showed pleural effusion of left. Given lasix 40mg  once daily. Recheck in 1 week - Patient has plenty of Nystatin ointment at home so dicussed using this over the affected areas daily. Discussed importance of keeping area as dry as possible and fully drying off after shower. If rash worsens or fails to improve come back in.  - Nitrites positive, large amount of leukocytes, and trace blood seen on UA indicating cystitis. Ciprofloxacin prescribed. Instructed patient to call or come back in if pain persists after treatment. Will culture.  - Patient referred to GI for difficulty  swallowing and barium swallow test.

## 2016-12-01 NOTE — Patient Instructions (Signed)

## 2016-12-08 ENCOUNTER — Encounter: Payer: Self-pay | Admitting: Physician Assistant

## 2016-12-08 ENCOUNTER — Ambulatory Visit (INDEPENDENT_AMBULATORY_CARE_PROVIDER_SITE_OTHER): Payer: Medicare Other | Admitting: Physician Assistant

## 2016-12-08 VITALS — BP 106/69 | HR 80 | Ht 63.5 in | Wt 168.0 lb

## 2016-12-08 DIAGNOSIS — R0602 Shortness of breath: Secondary | ICD-10-CM

## 2016-12-08 DIAGNOSIS — Z87440 Personal history of urinary (tract) infections: Secondary | ICD-10-CM

## 2016-12-08 DIAGNOSIS — J90 Pleural effusion, not elsewhere classified: Secondary | ICD-10-CM

## 2016-12-08 DIAGNOSIS — R809 Proteinuria, unspecified: Secondary | ICD-10-CM | POA: Diagnosis not present

## 2016-12-08 LAB — POCT URINALYSIS DIPSTICK
GLUCOSE UA: NEGATIVE
KETONES UA: NEGATIVE
Leukocytes, UA: NEGATIVE
NITRITE UA: NEGATIVE
Protein, UA: 30
Urobilinogen, UA: 0.2
pH, UA: 5

## 2016-12-08 MED ORDER — ALBUTEROL SULFATE HFA 108 (90 BASE) MCG/ACT IN AERS: 2.0000 | INHALATION_SPRAY | Freq: Four times a day (QID) | RESPIRATORY_TRACT | 0 refills | Status: DC | PRN

## 2016-12-08 NOTE — Progress Notes (Addendum)
Subjective:     Patient ID: Madison Rollins, female   DOB: 1928/03/04, 83 y.o.   MRN: [id number]  HPI  This is an 83 year old female who presents for 1 week follow up after UTI and pleural effusion. States her abdominal pain has completely resolved and denies urinary frequency, dysuria, hematuria. Took all but 2 pills of her antibiotic. Patient was called Friday about pleural effusion and Lasix prescription however forgot about it and never picked up the medication. SOB while walking continues however patient and daughter state this has been happening "for a long time." No chest pain.  Patient reports she canceled her visit to gastroenterologist and does not want barium swallow.   Patient reports taking 4 200 mg Ibuprofen daily for "a long time."   Review of Systems See HPI.    Objective:   Physical Exam  Constitutional: She is oriented to person, place, and time. She appears well-developed and well-nourished.  HENT:  Head: Normocephalic and atraumatic.  Neck: Normal range of motion. Neck supple.  Cardiovascular: Normal rate and regular rhythm.  Exam reveals no gallop and no friction rub.   No murmur heard. Pulmonary/Chest: Effort normal.  No wheezes, rales, crackles, rhonchi heard.  Abdominal: Soft. Bowel sounds are normal. There is no tenderness. There is no rebound and no guarding.  Musculoskeletal: Normal range of motion. She exhibits no edema.  Neurological: She is alert and oriented to person, place, and time.  Skin: Skin is warm and dry.  Small erythematous rash in skin folds on bilateral abdomen. No yeast or maceration visualized.  Psychiatric: She has a normal mood and affect. Her behavior is normal.   Urinalysis    Component Value Date/Time   BILIRUBINUR small 12/08/2016 0836   PROTEINUR 30 12/08/2016 0836   UROBILINOGEN 0.2 12/08/2016 0836   NITRITE neg 12/08/2016 0836   LEUKOCYTESUR Negative 12/08/2016 0836        Assessment/Plan:  Diagnoses and all orders for this  visit:  Proteinuria, unspecified type  History of UTI -     POCT urinalysis dipstick  Pleural effusion, left        Other orders -     albuterol (PROVENTIL HFA;VENTOLIN HFA) 108 (90 Base) MCG/ACT inhaler; Inhale 2 puffs into the lungs every 6 (six) hours as needed for wheezing or shortness of breath.  - POCT urinalysis dipstick showed no leukocyte esterase, no nitrites and patient no longer has abdominal pain so infection has resolved. There is trace protein and lysed blood however. Creatinine 0.90 on 12/01/16. Discussed this may be due to recent infection or NSAID use but will recheck in 2 weeks to ensure no protein in urine.  - No crackles heard on exam today so pleural effusion has likely almost resolved spontaneously however encouraged patient to pick Lasix up and take it for one week to help with any fluid that may remain and given she is still short of breath. Also prescribed albuterol if shortness of breath becomes severe. If shortness of breath continues and is not relieved with albuterol will perform spirometry.

## 2016-12-08 NOTE — Patient Instructions (Signed)
Pleural Effusion  A pleural effusion is an abnormal buildup of fluid in the layers of tissue between your lungs and the inside of your chest (pleural space). These two layers of tissue that line both your lungs and the inside of your chest are called pleura. Usually, there is no air in the space between the pleura, only a thin layer of fluid. If left untreated, a large amount of fluid can build up and cause the lung to collapse. A pleural effusion is usually caused by another disease that requires treatment.  The two main types of pleural effusion are:  · Transudative pleural effusion. This happens when fluid leaks into the pleural space because of a low protein count in your blood or high blood pressure in your vessels. Heart failure often causes this.  · Exudative infusion. This occurs when fluid collects in the pleural space from blocked blood vessels or lymph vessels. Some lung diseases, injuries, and cancers can cause this type of effusion.    What are the causes?  Pleural effusion can be caused by:  · Heart failure.  · A blood clot in the lung (pulmonary embolism).  · Pneumonia.  · Cancer.  · Liver failure (cirrhosis).  · Kidney disease.  · Complications from surgery, such as from open heart surgery.    What are the signs or symptoms?  In some cases, pleural effusion may cause no symptoms. Symptoms can include:  · Shortness of breath, especially when lying down.  · Chest pain, often worse when taking a deep breath.  · Fever.  · Dry cough that is lasting (chronic).  · Hiccups.  · Rapid breathing.    An underlying condition that is causing the pleural effusion (such as heart failure, pneumonia, blood clots, tuberculosis, or cancer) may also cause additional symptoms.  How is this diagnosed?  Your health care provider may suspect pleural effusion based on your symptoms and medical history. Your health care provider will also do a physical exam and a chest X-ray. If the X-ray shows there is fluid in your chest,  you may need to have this fluid removed using a needle (thoracentesis) so it can be tested.  You may also have:  · Imaging studies of the chest, such as:  ? Ultrasound.  ? CT scan.  · Blood tests for kidney and liver function.    How is this treated?  Treatment depends on the cause of the pleural effusion. Treatment may include:  · Taking antibiotic medicines to clear up an infection that is causing the pleural effusion.  · Placing a tube in the chest to drain the effusion (tube thoracostomy). This procedure is often used when there is an infection in the fluid.  · Surgery to remove the fibrous outer layer of tissue from the pleural space (decortication).  · Thoracentesis, which can improve cough and shortness of breath.  · A procedure to put medicine into the chest cavity to seal the pleural space to prevent fluid buildup (pleurodesis).  · Chemotherapy and radiation therapy. These may be required in the case of cancerous (malignant) pleural effusion.    Follow these instructions at home:  · Take medicines only as directed by your health care provider.  · Keep track of how long you can gently exercise before you get short of breath. Try simply walking at first.  · Do not use any tobacco products, including cigarettes, chewing tobacco, or electronic cigarettes. If you need help quitting, ask your health care provider.  ·   Keep all follow-up visits as directed by your health care provider. This is important.  Contact a health care provider if:  · The amount of time that you are able to exercise decreases or does not improve with time.  · You have pain or signs of infection at the puncture site if you had thoracentesis. Watch for:  ? Drainage.  ? Redness.  ? Swelling.  · You have a fever.  Get help right away if:  · You are short of breath.  · You develop chest pain.  · You develop a new cough.  This information is not intended to replace advice given to you by your health care provider. Make sure you discuss any  questions you have with your health care provider.  Document Released: 09/20/2005 Document Revised: 02/23/2016 Document Reviewed: 02/13/2014  Elsevier Interactive Patient Education © 2017 [employer]

## 2016-12-09 DIAGNOSIS — M545 Low back pain: Secondary | ICD-10-CM | POA: Diagnosis not present

## 2016-12-09 DIAGNOSIS — K219 Gastro-esophageal reflux disease without esophagitis: Secondary | ICD-10-CM | POA: Diagnosis not present

## 2016-12-09 DIAGNOSIS — I10 Essential (primary) hypertension: Secondary | ICD-10-CM | POA: Diagnosis not present

## 2016-12-09 DIAGNOSIS — M25551 Pain in right hip: Secondary | ICD-10-CM | POA: Diagnosis not present

## 2016-12-10 ENCOUNTER — Ambulatory Visit: Payer: Medicare Other | Admitting: Gastroenterology

## 2016-12-17 ENCOUNTER — Ambulatory Visit (INDEPENDENT_AMBULATORY_CARE_PROVIDER_SITE_OTHER): Payer: Medicare Other | Admitting: Physician Assistant

## 2016-12-17 VITALS — BP 133/71 | HR 70 | Wt 175.0 lb

## 2016-12-17 DIAGNOSIS — G3184 Mild cognitive impairment, so stated: Secondary | ICD-10-CM | POA: Diagnosis not present

## 2016-12-17 DIAGNOSIS — R413 Other amnesia: Secondary | ICD-10-CM | POA: Insufficient documentation

## 2016-12-17 DIAGNOSIS — R0602 Shortness of breath: Secondary | ICD-10-CM

## 2016-12-17 DIAGNOSIS — J449 Chronic obstructive pulmonary disease, unspecified: Secondary | ICD-10-CM | POA: Diagnosis not present

## 2016-12-17 MED ORDER — UMECLIDINIUM-VILANTEROL 62.5-25 MCG/INH IN AEPB: 1.0000 | INHALATION_SPRAY | Freq: Every day | RESPIRATORY_TRACT | 5 refills | Status: DC

## 2016-12-17 MED ORDER — ALBUTEROL SULFATE HFA 108 (90 BASE) MCG/ACT IN AERS: 2.0000 | INHALATION_SPRAY | Freq: Four times a day (QID) | RESPIRATORY_TRACT | 2 refills | Status: AC | PRN

## 2016-12-17 MED ORDER — ALBUTEROL SULFATE (2.5 MG/3ML) 0.083% IN NEBU: 2.5000 mg | INHALATION_SOLUTION | Freq: Once | RESPIRATORY_TRACT | Status: AC

## 2016-12-17 NOTE — Progress Notes (Signed)
   Subjective:    Patient ID: Madison Rollins, female    DOB: [birth year], 83 y.o.   MRN: [id number]  HPI Pt is a 83 yo female who presents to the clinic for spironmetry due to SOB.   Her daughter is also concerned with confusion and memory loss. Pt is able to take care of herself but forgetting appts and forgetting what people tell her. No agitation.    Review of Systems  All other systems reviewed and are negative.      Objective:   Physical Exam  Constitutional: She is oriented to person, place, and time. She appears well-developed and well-nourished.  HENT:  Head: Normocephalic and atraumatic.  Cardiovascular: Normal rate, regular rhythm and normal heart sounds.   Pulmonary/Chest: Effort normal and breath sounds normal.  Coarse breath sounds.   Neurological: She is alert and oriented to person, place, and time.  Psychiatric: She has a normal mood and affect. Her behavior is normal.          Assessment & Plan:  Marland KitchenMarland KitchenPearl was seen today for shortness of breath.  Diagnoses and all orders for this visit:  COPD (chronic obstructive pulmonary disease) with chronic bronchitis (HCC)  SOB (shortness of breath) -     Spirometry: Pre & Post Eval -     albuterol (PROVENTIL) (2.5 MG/3ML) 0.083% nebulizer solution 2.5 mg; Take 3 mLs (2.5 mg total) by nebulization once.  Shortness of breath -     albuterol (PROVENTIL HFA;VENTOLIN HFA) 108 (90 Base) MCG/ACT inhaler; Inhale 2 puffs into the lungs every 6 (six) hours as needed for wheezing or shortness of breath.  Memory changes  Other orders -     umeclidinium-vilanterol (ANORO ELLIPTA) 62.5-25 MCG/INH AEPB; Inhale 1 puff into the lungs daily.   .. 6CIT Screen 12/17/2016  What Year? 0 points  What month? 0 points  What time? 0 points  Count back from 20 0 points  Months in reverse 4 points  Repeat phrase 8 points  Total Score 12    SEVERE COPD with initial FEV1 at 19 percent and 209 percent change in small airways.  started  anoro with albuterol as needed.  Follow up in 2 months.  Does appear to have some mild cognition impairment. Discussed aricept. Pt declines. Family would like to consider it. Will come back for full follow up.

## 2016-12-17 NOTE — Patient Instructions (Signed)
Umeclidinium; Vilanterol inhalation powder What is this medicine? UMECLIDINIUM; VILANTEROL (ue MEK li DIN ee um; vye LAN ter ol) inhalation is a combination of two medicines that decrease inflammation and help to open up the airways of your lungs. It is for chronic obstructive pulmonary disease (COPD), including chronic bronchitis or emphysema. Do NOT use for asthma or an acute asthma attack. Do NOT use for a COPD attack. This medicine may be used for other purposes; ask your health care provider or pharmacist if you have questions. COMMON BRAND NAME(S): ANORO ELLIPTA What should I tell my health care provider before I take this medicine? They need to know if you have any of these conditions: -bladder problems or difficulty passing urine -diabetes -glaucoma -heart disease or irregular heartbeat -high blood pressure -kidney disease -pheochromocytoma -prostate disease -seizures -thyroid disease -an unusual or allergic reaction to umeclidinium, vilanterol, lactose, milk proteins, other medicines, foods, dyes, or preservatives -pregnant or trying to get pregnant -breast-feeding How should I use this medicine? This medicine is inhaled through the mouth. It is used once per day. Follow the directions on the prescription label. Do not use a spacer device with this inhaler. Take your medicine at regular intervals. Do not take your medicine more often than directed. Do not stop taking except on your doctor's advice. Make sure that you are using your inhaler correctly. Ask you doctor or health care provider if you have any questions. A special MedGuide will be given to you by the pharmacist with each prescription and refill. Be sure to read this information carefully each time. Talk to your pediatrician regarding the use of this medicine in children. Special care may be needed. Overdosage: If you think you have taken too much of this medicine contact a poison control center or emergency room at  once. NOTE: This medicine is only for you. Do not share this medicine with others. What if I miss a dose? If you miss a dose, use it as soon as you can. If it is almost time for your next dose, use only that dose and continue with your regular schedule. Do not use double or extra doses. What may interact with this medicine? Do not take this medicine with any of the following medications: -cisapride -dofetilide -dronedarone -MAOIs like Carbex, Eldepryl, Marplan, Nardil, and Parnate -pimozide -thioridazine -ziprasidone This medicine may also interact with the following medications: -antihistamines for allergy -antiviral medicines for HIV or AIDS -atropine -beta-blockers like metoprolol and propranolol -certain medicines for bladder problems like oxybutynin, tolterodine -certain medicines for depression, anxiety, or psychotic disturbances -certain medicines for Parkinson's disease like benztropine, trihexyphenidyl -certain medicines for stomach problems like dicyclomine, hyoscyamine -certain medicines for travel sickness like scopolamine -diuretics -ipratropium -medicines for colds -medicines for fungal infections like ketoconazole and itraconazole -other medicines for breathing problems -other medicines that prolong the QT interval (cause an abnormal heart rhythm) -tiotropium This list may not describe all possible interactions. Give your health care provider a list of all the medicines, herbs, non-prescription drugs, or dietary supplements you use. Also tell them if you smoke, drink alcohol, or use illegal drugs. Some items may interact with your medicine. What should I watch for while using this medicine? Visit your doctor or health care professional for regular checkups. Tell your doctor or health care professional if your symptoms do not get better. If your symptoms get worse or if you need your short-acting inhalers more often, call your doctor right away. Do not use this medicine  more than   once every 24 hours. What side effects may I notice from receiving this medicine? Side effects that you should report to your doctor or health care professional as soon as possible: -allergic reactions like skin rash or hives, swelling of the face, lips, or tongue -breathing problems right after inhaling your medicine -changes in vision -chest pain -eye pain -fast, irregular heartbeat -feeling faint or lightheaded, falls -fever or chills -nausea, vomiting -trouble passing urine or change in the amount of urine Side effects that usually do not require medical attention (report to your doctor or health care professional if they continue or are bothersome): -constipation -cough -diarrhea -headache -muscle cramps -nervousness -sore throat -tremor This list may not describe all possible side effects. Call your doctor for medical advice about side effects. You may report side effects to FDA at 1-800-FDA-1088. Where should I keep my medicine? Keep out of the reach of children. Store at room temperature between 15 and 30 degrees C (59 and 86 degrees F). Store in a dry place away from direct heat or sunlight. Throw away 6 weeks after you remove the inhaler from the foil tray, or after the dose indicator reads 0, whichever comes first. Throw away any unopened packages after the expiration date. NOTE: This sheet is a summary. It may not cover all possible information. If you have questions about this medicine, talk to your doctor, pharmacist, or health care provider.  2018 Elsevier/Gold Standard (2016-08-23 13:44:47)  

## 2016-12-18 DIAGNOSIS — G3184 Mild cognitive impairment, so stated: Secondary | ICD-10-CM | POA: Insufficient documentation

## 2017-01-12 ENCOUNTER — Ambulatory Visit (INDEPENDENT_AMBULATORY_CARE_PROVIDER_SITE_OTHER): Payer: Medicare Other | Admitting: Physician Assistant

## 2017-01-12 ENCOUNTER — Encounter: Payer: Self-pay | Admitting: Physician Assistant

## 2017-01-12 VITALS — BP 143/81 | HR 75

## 2017-01-12 DIAGNOSIS — R1031 Right lower quadrant pain: Secondary | ICD-10-CM

## 2017-01-12 DIAGNOSIS — R109 Unspecified abdominal pain: Secondary | ICD-10-CM | POA: Diagnosis not present

## 2017-01-12 DIAGNOSIS — K4090 Unilateral inguinal hernia, without obstruction or gangrene, not specified as recurrent: Secondary | ICD-10-CM

## 2017-01-12 DIAGNOSIS — Z87440 Personal history of urinary (tract) infections: Secondary | ICD-10-CM | POA: Diagnosis not present

## 2017-01-12 NOTE — Patient Instructions (Signed)
Will get you in with general surgery for right inguinal hernia consult.

## 2017-01-12 NOTE — Progress Notes (Signed)
   Subjective:    Patient ID: Madison Rollins, female    DOB: June 09, 1928, 83 y.o.   MRN: [id number]  HPI  Pt is a an 83 yo female who presents to the clinic with right lower abdomen pain that radiates into her right flank. Recently she had similar pain and had a UTI. She felt like pain improved with abx. No fever, chills, body aches today. She denies any dysuria or changes in urinary frequency. She reports her bowel movements are normal. She is on vesicare for OAB. Pain is worse when trying to get up. She fell recently and last hip xray was negative for any acute findings. Rates pain at times 8/10.        Review of Systems  All other systems reviewed and are negative.      Objective:   Physical Exam  Constitutional: She is oriented to person, place, and time. She appears well-developed and well-nourished.  HENT:  Head: Normocephalic and atraumatic.  Cardiovascular: Normal rate, regular rhythm and normal heart sounds.   Pulmonary/Chest: Effort normal and breath sounds normal.  NO CVA tenderness.   Abdominal: Soft. Bowel sounds are normal.  No visible bulge palpated in right lower quadrant. Mild diffuse tenderness. No guarding or rebound. Pain was illicited when getting up and sitting up.   Neurological: She is alert and oriented to person, place, and time.  Psychiatric: She has a normal mood and affect. Her behavior is normal.          Assessment & Plan:  Marland KitchenMarland KitchenDiagnoses and all orders for this visit:  Right inguinal hernia -     Ambulatory referral to General Surgery  Right flank pain -     Ambulatory referral to General Surgery  History of UTI  Right lower quadrant abdominal pain -     Ambulatory referral to General Surgery     .Marland Kitchen Results for orders placed or performed in visit on 12/08/16  POCT urinalysis dipstick  Result Value Ref Range   Color, UA dark yellow    Clarity, UA slightly cloudy    Glucose, UA neg    Bilirubin, UA small    Ketones, UA neg    Spec  Grav, UA >=1.030    Blood, UA trace-lysed    pH, UA 5.0    Protein, UA 30    Urobilinogen, UA 0.2    Nitrite, UA neg    Leukocytes, UA Negative Negative   Will culture. Negative for nitrates but has some blood and protein present.  Will hold off on treating for a UTI at this point. Please call with any fever, chills, painful urination.  Did read from CT in care everywhere done at hospital that she had a right inguinal hernia. I will send to general surgery to evaluate and see if they think could be causing the pain.  Certainly pain could represent right hip pain due to location. Last xray 11/2016 did not show and significant findings. Will consider MRI if pain is not improving.

## 2017-01-14 ENCOUNTER — Encounter: Payer: Self-pay | Admitting: Physician Assistant

## 2017-01-14 DIAGNOSIS — Z87440 Personal history of urinary (tract) infections: Secondary | ICD-10-CM | POA: Insufficient documentation

## 2017-01-14 DIAGNOSIS — K4090 Unilateral inguinal hernia, without obstruction or gangrene, not specified as recurrent: Secondary | ICD-10-CM | POA: Insufficient documentation

## 2017-01-14 DIAGNOSIS — R10A1 Flank pain, right side: Secondary | ICD-10-CM | POA: Insufficient documentation

## 2017-01-14 DIAGNOSIS — R109 Unspecified abdominal pain: Secondary | ICD-10-CM | POA: Insufficient documentation

## 2017-01-14 DIAGNOSIS — R1031 Right lower quadrant pain: Secondary | ICD-10-CM | POA: Insufficient documentation

## 2017-01-28 ENCOUNTER — Telehealth: Payer: Self-pay | Admitting: Physician Assistant

## 2017-01-28 NOTE — Telephone Encounter (Signed)
Thank you :)

## 2017-01-28 NOTE — Telephone Encounter (Signed)
Pt was scheduled with Dr. Kae Heller for a hernia repair. Pt reports she "doesn't want to have it done anymore." Called Dr. Ron Parker office and cancelled surgery.

## 2017-01-28 NOTE — Telephone Encounter (Signed)
Madison Rollins wants to have her appt for surgery that was made for her next week on the 2nd or 3rd of May to be cancelled. I couldn't find where she was having the procedure and she didn't know either. If possible could you cancel the appt or give her a call to give her the number to cancel the appt.

## 2017-02-25 ENCOUNTER — Other Ambulatory Visit: Payer: Self-pay | Admitting: Physician Assistant

## 2017-02-25 DIAGNOSIS — M545 Low back pain, unspecified: Secondary | ICD-10-CM

## 2017-02-25 DIAGNOSIS — W19XXXD Unspecified fall, subsequent encounter: Secondary | ICD-10-CM

## 2017-02-25 DIAGNOSIS — M25551 Pain in right hip: Secondary | ICD-10-CM

## 2017-03-18 ENCOUNTER — Ambulatory Visit: Payer: Medicare Other

## 2017-03-18 ENCOUNTER — Ambulatory Visit: Payer: Medicare Other | Admitting: Physician Assistant

## 2017-03-30 ENCOUNTER — Ambulatory Visit: Payer: Medicare Other | Admitting: Family Medicine

## 2017-04-04 ENCOUNTER — Ambulatory Visit: Payer: Medicare Other | Admitting: Family Medicine

## 2017-04-04 ENCOUNTER — Ambulatory Visit (INDEPENDENT_AMBULATORY_CARE_PROVIDER_SITE_OTHER): Payer: Medicare Other | Admitting: Family Medicine

## 2017-04-04 VITALS — BP 141/75 | HR 64 | Temp 97.7°F | Wt 167.0 lb

## 2017-04-04 DIAGNOSIS — R410 Disorientation, unspecified: Secondary | ICD-10-CM | POA: Diagnosis not present

## 2017-04-04 DIAGNOSIS — Z87440 Personal history of urinary (tract) infections: Secondary | ICD-10-CM

## 2017-04-04 DIAGNOSIS — G3184 Mild cognitive impairment, so stated: Secondary | ICD-10-CM

## 2017-04-04 DIAGNOSIS — J449 Chronic obstructive pulmonary disease, unspecified: Secondary | ICD-10-CM

## 2017-04-04 DIAGNOSIS — J4489 Other specified chronic obstructive pulmonary disease: Secondary | ICD-10-CM

## 2017-04-04 LAB — POCT URINALYSIS DIPSTICK
BILIRUBIN UA: NEGATIVE
Blood, UA: NEGATIVE
GLUCOSE UA: NEGATIVE
KETONES UA: NEGATIVE
Leukocytes, UA: NEGATIVE
NITRITE UA: NEGATIVE
PH UA: 5.5 (ref 5.0–8.0)
Protein, UA: NEGATIVE
Spec Grav, UA: 1.025 (ref 1.010–1.025)
Urobilinogen, UA: 0.2 E.U./dL

## 2017-04-04 NOTE — Progress Notes (Signed)
Madison Rollins is a 83 y.o. female who presents to [facility] today for follow-up COPD and discuss memory disturbance.  Patient has COPD and is currently using combination LABA/LAMA therapy. This works quite well while started breathing more easily be more functional at home.  Memory disturbance: Patient has had worsening cognition and memory over the last several months. Her home health nurse is concerned that she may have a urinary tract infection and her family is concerned that she may have dementia. She had any problems. She denies any urinary frequency urgency or dysuria currently.   Past Medical History:  Diagnosis Date  . Cancer ([place])   . Frequency of urination   . Hyperlipidemia   . Hypertension   . Thyroid disease    Past Surgical History:  Procedure Laterality Date  . BACK SURGERY    . bladder tacked  2010   Social History  Substance Use Topics  . Smoking status: Never Smoker  . Smokeless tobacco: Never Used  . Alcohol use No   family history includes Cancer in her father and sister; Diabetes in her brother; Hyperlipidemia in her mother; Hypertension in her mother.  ROS as above:  Medications: Current Outpatient Prescriptions  Medication Sig Dispense Refill  . albuterol (PROVENTIL HFA;VENTOLIN HFA) 108 (90 Base) MCG/ACT inhaler Inhale 2 puffs into the lungs every 6 (six) hours as needed for wheezing or shortness of breath. 1 Inhaler 2  . ibuprofen (ADVIL,MOTRIN) 200 MG tablet Take 2 tablets ( 400 milligrams total) every 6 with food as needed for pain. 30 tablet 0  . metoprolol tartrate (LOPRESSOR) 25 MG tablet Take 25 mg by mouth daily.   5  . nystatin ointment (MYCOSTATIN) Apply 1 application topically 2 (two) times daily. 30 g 1  . SYNTHROID 100 MCG tablet Take 100 mcg by mouth daily before breakfast.     . traMADol (ULTRAM) 50 MG tablet TAKE 1  TABLET BY MOUTH EVERY 8 HOURS AS NEEDED 30 tablet 0  . umeclidinium-vilanterol (ANORO ELLIPTA) 62.5-25 MCG/INH AEPB Inhale 1 puff into the lungs daily. 1 each 5  . VESICARE 5 MG tablet Take 5 mg by mouth daily.     . Vitamin D, Ergocalciferol, (DRISDOL) 50000 units CAPS capsule Take 50,000 Units by mouth every 7 (seven) days.      Current Facility-Administered Medications  Medication Dose Route Frequency Provider Last Rate Last Dose  . albuterol (PROVENTIL) (2.5 MG/3ML) 0.083% nebulizer solution 2.5 mg  2.5 mg Nebulization Once Donella Stade, PA-C       No Known Allergies  Health Maintenance Health Maintenance  Topic Date Due  . TETANUS/TDAP  04/19/1949  . DEXA SCAN  04/20/1995  . PNA vac Low Risk Adult (1 of 2 - PCV13) 04/20/1995  . INFLUENZA VACCINE  05/04/2017     Exam:  BP (!) 141/75 (BP Location: Left Arm, Patient Position: Sitting, Cuff Size: Large)   Pulse 64   Temp 97.7 F (36.5 C)   Wt 167 lb (75.8 kg)   SpO2 97%   BMI 29.12 kg/m  Gen: Well NAD HEENT: EOMI,  MMM Lungs: Normal work of breathing. CTABL Heart: RRR no MRG Abd: NABS, Soft. Nondistended, Nontender Exts: Brisk capillary refill, warm and well perfused.  Psych: Alert and oriented normal speech and thought process. No SI or HI expressed.   Results for orders placed or performed in visit on 04/04/17 (from the past 72 hour(s))  POCT  Urinalysis Dipstick     Status: None   Collection Time: 04/04/17 11:28 AM  Result Value Ref Range   Color, UA yellow    Clarity, UA Clear    Glucose, UA N    Bilirubin, UA N    Ketones, UA N    Spec Grav, UA 1.025 1.010 - 1.025   Blood, UA N    pH, UA 5.5 5.0 - 8.0   Protein, UA N    Urobilinogen, UA 0.2 0.2 or 1.0 E.U./dL   Nitrite, UA N    Leukocytes, UA Negative Negative   No results found.    Assessment and Plan: 83 y.o. female with  COPD: Doing well continue inhalers.  Memory disturbance: I'm concerned patient has developed mild dementia. Her primary  care doctor has noted memory changes over time. I think it's reasonable to proceed with a formal neurocognitive evaluation.  Follow-up with PCP in 3 months.   Orders Placed This Encounter  Procedures  . Ambulatory referral to Neuropsychology    Referral Priority:   Routine    Referral Type:   Psychiatric    Referral Reason:   Specialty Services Required    Requested Specialty:   Psychology    Number of Visits Requested:   1  . POCT Urinalysis Dipstick   No orders of the defined types were placed in this encounter.    Discussed warning signs or symptoms. Please see discharge instructions. Patient expresses understanding.

## 2017-04-04 NOTE — Patient Instructions (Signed)
Thank you for coming in today. Continue the inhaler.  You should hear about the memory test soon.  Follow up with Jade in 3 months.

## 2017-04-05 ENCOUNTER — Ambulatory Visit: Payer: Medicare Other | Admitting: Family Medicine

## 2017-05-09 ENCOUNTER — Telehealth: Payer: Self-pay | Admitting: Physician Assistant

## 2017-05-09 MED ORDER — SYNTHROID 100 MCG PO TABS: 100.0000 ug | ORAL_TABLET | Freq: Every day | ORAL | 0 refills | Status: DC

## 2017-05-09 NOTE — Telephone Encounter (Signed)
Patient advised that appointment is needed for further refills. Javone Ybanez,CMA

## 2017-05-09 NOTE — Telephone Encounter (Signed)
Patient's daughter called req to know if pt can get thyroid med refilled she is out. Pharmacy is [facility]. Please Adv

## 2017-05-10 ENCOUNTER — Encounter: Payer: Medicare Other | Attending: Psychology | Admitting: Psychology

## 2017-05-11 ENCOUNTER — Telehealth: Payer: Self-pay | Admitting: Physician Assistant

## 2017-05-11 NOTE — Telephone Encounter (Signed)
Patient needs to have pharmacy changed to [facility] on [address]. Pt's daughter will go pick up synthroid prescription from [facility] on [address] today but req to have changed for further refills-vew

## 2017-05-12 NOTE — Telephone Encounter (Signed)
Patient daughter has been informed. Rhonda Cunningham,CMA

## 2017-05-16 ENCOUNTER — Ambulatory Visit (INDEPENDENT_AMBULATORY_CARE_PROVIDER_SITE_OTHER): Payer: Medicare Other | Admitting: Physician Assistant

## 2017-05-16 ENCOUNTER — Encounter: Payer: Self-pay | Admitting: Physician Assistant

## 2017-05-16 VITALS — BP 155/78 | HR 67 | Ht 65.0 in | Wt 165.0 lb

## 2017-05-16 DIAGNOSIS — G3184 Mild cognitive impairment, so stated: Secondary | ICD-10-CM | POA: Diagnosis not present

## 2017-05-16 DIAGNOSIS — Z13820 Encounter for screening for osteoporosis: Secondary | ICD-10-CM

## 2017-05-16 DIAGNOSIS — J4489 Other specified chronic obstructive pulmonary disease: Secondary | ICD-10-CM

## 2017-05-16 DIAGNOSIS — R413 Other amnesia: Secondary | ICD-10-CM | POA: Diagnosis not present

## 2017-05-16 DIAGNOSIS — E039 Hypothyroidism, unspecified: Secondary | ICD-10-CM

## 2017-05-16 DIAGNOSIS — Z13220 Encounter for screening for lipoid disorders: Secondary | ICD-10-CM | POA: Diagnosis not present

## 2017-05-16 DIAGNOSIS — Z79899 Other long term (current) drug therapy: Secondary | ICD-10-CM

## 2017-05-16 DIAGNOSIS — J449 Chronic obstructive pulmonary disease, unspecified: Secondary | ICD-10-CM

## 2017-05-16 MED ORDER — UMECLIDINIUM-VILANTEROL 62.5-25 MCG/INH IN AEPB: 1.0000 | INHALATION_SPRAY | Freq: Every day | RESPIRATORY_TRACT | 5 refills | Status: DC

## 2017-05-16 MED ORDER — DONEPEZIL HCL 10 MG PO TABS: 5.0000 mg | ORAL_TABLET | Freq: Every evening | ORAL | 1 refills | Status: DC | PRN

## 2017-05-16 NOTE — Patient Instructions (Addendum)
Donepezil tablets What is this medicine? DONEPEZIL (doe NEP e zil) is used to treat mild to moderate dementia caused by Alzheimer's disease. This medicine may be used for other purposes; ask your health care provider or pharmacist if you have questions. COMMON BRAND NAME(S): Aricept What should I tell my health care provider before I take this medicine? They need to know if you have any of these conditions: -asthma or other lung disease -difficulty passing urine -head injury -heart disease -history of irregular heartbeat -liver disease -seizures (convulsions) -stomach or intestinal disease, ulcers or stomach bleeding -an unusual or allergic reaction to donepezil, other medicines, foods, dyes, or preservatives -pregnant or trying to get pregnant -breast-feeding How should I use this medicine? Take this medicine by mouth with a glass of water. Follow the directions on the prescription label. You may take this medicine with or without food. Take this medicine at regular intervals. This medicine is usually taken before bedtime. Do not take it more often than directed. Continue to take your medicine even if you feel better. Do not stop taking except on your doctor's advice. If you are taking the 23 mg donepezil tablet, swallow it whole; do not cut, crush, or chew it. Talk to your pediatrician regarding the use of this medicine in children. Special care may be needed. Overdosage: If you think you have taken too much of this medicine contact a poison control center or emergency room at once. NOTE: This medicine is only for you. Do not share this medicine with others. What if I miss a dose? If you miss a dose, take it as soon as you can. If it is almost time for your next dose, take only that dose, do not take double or extra doses. What may interact with this medicine? Do not take this medicine with any of the following medications: -certain medicines for fungal infections like itraconazole,  fluconazole, posaconazole, and voriconazole -cisapride -dextromethorphan; quinidine -dofetilide -dronedarone -pimozide -quinidine -thioridazine -ziprasidone This medicine may also interact with the following medications: -antihistamines for allergy, cough and cold -atropine -bethanechol -carbamazepine -certain medicines for bladder problems like oxybutynin, tolterodine -certain medicines for Parkinson's disease like benztropine, trihexyphenidyl -certain medicines for stomach problems like dicyclomine, hyoscyamine -certain medicines for travel sickness like scopolamine -dexamethasone -ipratropium -NSAIDs, medicines for pain and inflammation, like ibuprofen or naproxen -other medicines for Alzheimer's disease -other medicines that prolong the QT interval (cause an abnormal heart rhythm) -phenobarbital -phenytoin -rifampin, rifabutin or rifapentine This list may not describe all possible interactions. Give your health care provider a list of all the medicines, herbs, non-prescription drugs, or dietary supplements you use. Also tell them if you smoke, drink alcohol, or use illegal drugs. Some items may interact with your medicine. What should I watch for while using this medicine? Visit your doctor or health care professional for regular checks on your progress. Check with your doctor or health care professional if your symptoms do not get better or if they get worse. You may get drowsy or dizzy. Do not drive, use machinery, or do anything that needs mental alertness until you know how this drug affects you. What side effects may I notice from receiving this medicine? Side effects that you should report to your doctor or health care professional as soon as possible: -allergic reactions like skin rash, itching or hives, swelling of the face, lips, or tongue -feeling faint or lightheaded, falls -loss of bladder control -seizures -signs and symptoms of a dangerous change in heartbeat or  heart   rhythm like chest pain; dizziness; fast or irregular heartbeat; palpitations; feeling faint or lightheaded, falls; breathing problems -signs and symptoms of infection like fever or chills; cough; sore throat; pain or trouble passing urine -signs and symptoms of liver injury like dark yellow or brown urine; general ill feeling or flu-like symptoms; light-colored stools; loss of appetite; nausea; right upper belly pain; unusually weak or tired; yellowing of the eyes or skin -slow heartbeat or palpitations -unusual bleeding or bruising -vomiting Side effects that usually do not require medical attention (report to your doctor or health care professional if they continue or are bothersome): -diarrhea, especially when starting treatment -headache -loss of appetite -muscle cramps -nausea -stomach upset This list may not describe all possible side effects. Call your doctor for medical advice about side effects. You may report side effects to FDA at 1-800-FDA-1088. Where should I keep my medicine? Keep out of reach of children. Store at room temperature between 15 and 30 degrees C (59 and 86 degrees F). Throw away any unused medicine after the expiration date. NOTE: This sheet is a summary. It may not cover all possible information. If you have questions about this medicine, talk to your doctor, pharmacist, or health care provider.  2018 Elsevier/Gold Standard (2016-03-08 21:00:42)  

## 2017-05-17 ENCOUNTER — Encounter: Payer: Self-pay | Admitting: Physician Assistant

## 2017-05-17 NOTE — Progress Notes (Signed)
Subjective:    Patient ID: Madison Rollins, female    DOB: 11/15/1927, 83 y.o.   MRN: [id number]  HPI Pt is a 83 yo female who presents to the clinic with family to discuss memory changes. Pt lives alone and drives around town. Family would like to make sure she doesn't need anymore care and to see if there is anything to help her cognitive decline. They have noticed over the past 6 months her forgetting conversations, calling and say unrealistic things at night, being more agitated. She is still able to care for her basic needs. Her daughter in law manages her money.   She is out of anoro for COPD. She has been coughing more.   She needs refills on thyroid medications. She is taking daily without any problems.    Active Ambulatory Problems    Diagnosis Date Noted  . Acute right-sided low back pain without sciatica 11/16/2016  . Right hip pain 11/16/2016  . Fall 11/16/2016  . Pleural effusion on left 12/01/2016  . COPD (chronic obstructive pulmonary disease) with chronic bronchitis ([place]) 12/17/2016  . Memory changes 12/17/2016  . Mild cognitive impairment 12/18/2016  . Right inguinal hernia 01/14/2017  . History of UTI 01/14/2017  . Right lower quadrant abdominal pain 01/14/2017  . Confusion 04/04/2017   Resolved Ambulatory Problems    Diagnosis Date Noted  . Right flank pain 01/14/2017   Past Medical History:  Diagnosis Date  . Cancer ([place])   . Frequency of urination   . Hyperlipidemia   . Hypertension   . Thyroid disease       Review of Systems See HPI.     Objective:   Physical Exam  Constitutional: She is oriented to person, place, and time. She appears well-developed and well-nourished.  HENT:  Head: Normocephalic and atraumatic.  Cardiovascular: Normal rate, regular rhythm and normal heart sounds.   Pulmonary/Chest: Effort normal and breath sounds normal. She has no wheezes.  Neurological: She is alert and oriented to person, place, and time.  Psychiatric: She  has a normal mood and affect. Her behavior is normal.          Assessment & Plan:  Marland KitchenMarland KitchenPearl was seen today for follow-up.  Diagnoses and all orders for this visit:  Memory changes -     donepezil (ARICEPT) 10 MG tablet; Take 0.5 tablets (5 mg total) by mouth at bedtime as needed.  Hypothyroidism, unspecified type -     TSH  Medication management -     TSH -     COMPLETE METABOLIC PANEL WITH GFR  Osteoporosis screening -     DG Bone Density; Future -     VITAMIN D 25 Hydroxy (Vit-D Deficiency, Fractures)  Screening for lipid disorders -     Lipid panel  Mild cognitive impairment -     donepezil (ARICEPT) 10 MG tablet; Take 0.5 tablets (5 mg total) by mouth at bedtime as needed.  COPD (chronic obstructive pulmonary disease) with chronic bronchitis (HCC) -     umeclidinium-vilanterol (ANORO ELLIPTA) 62.5-25 MCG/INH AEPB; Inhale 1 puff into the lungs daily.    .. MMSE - Mini Mental State Exam 05/16/2017  Orientation to time 5  Orientation to Place 3  Registration 3  Attention/ Calculation 0  Recall 3  Language- name 2 objects 2  Language- repeat 1  Language- follow 3 step command 3  Language- read & follow direction 1  Write a sentence 1  Copy design 1  Total score  23    Due to education level her goal would be 25 she is just a little under goal. Pt declined full neuropyschology evaluation. She does agree to start aricept. Discussed side effects. Follow up in 6 weeks. Discussed limiting driving. I do think she is ok to be at home alone since family and friends are checking on her consisently.   Pt is out of anoro for COPD. She has noticed coughing worsening. Refilled today. Call office if symptoms not improving.

## 2017-05-23 LAB — TSH: TSH: 10.91 mIU/L — ABNORMAL HIGH

## 2017-05-24 LAB — COMPLETE METABOLIC PANEL WITH GFR
ALT: 9 U/L (ref 6–29)
AST: 14 U/L (ref 10–35)
Albumin: 4 g/dL (ref 3.6–5.1)
Alkaline Phosphatase: 79 U/L (ref 33–130)
BUN: 15 mg/dL (ref 7–25)
CALCIUM: 9.5 mg/dL (ref 8.6–10.4)
CHLORIDE: 103 mmol/L (ref 98–110)
CO2: 24 mmol/L (ref 20–32)
CREATININE: 0.95 mg/dL — AB (ref 0.60–0.88)
GFR, EST AFRICAN AMERICAN: 62 mL/min (ref >=60)
GFR, EST NON AFRICAN AMERICAN: 54 mL/min — AB (ref >=60)
Glucose, Bld: 94 mg/dL (ref 65–99)
POTASSIUM: 4.5 mmol/L (ref 3.5–5.3)
Sodium: 139 mmol/L (ref 135–146)
Total Bilirubin: 0.5 mg/dL (ref 0.2–1.2)
Total Protein: 6.4 g/dL (ref 6.1–8.1)

## 2017-05-24 LAB — LIPID PANEL
CHOL/HDL RATIO: 4.6 ratio (ref <5.0)
Cholesterol: 187 mg/dL (ref <200)
HDL: 41 mg/dL — AB (ref >50)
LDL CALC: 115 mg/dL — AB (ref <100)
Triglycerides: 155 mg/dL — ABNORMAL HIGH (ref <150)
VLDL: 31 mg/dL — AB (ref <30)

## 2017-05-24 LAB — VITAMIN D 25 HYDROXY (VIT D DEFICIENCY, FRACTURES): VIT D 25 HYDROXY: 53 ng/mL (ref 30–100)

## 2017-05-25 NOTE — Progress Notes (Signed)
Call pt: vitamin D is great. Cholesterol and kidney are stable.  TSH elevated meaning still a little hypothyroid. Ok to increase synthroid to 174mcg daily first thing in morning without any other medications or food. Recheck in 4-6 weeks.

## 2017-06-08 ENCOUNTER — Other Ambulatory Visit: Payer: Medicare Other

## 2017-06-09 ENCOUNTER — Other Ambulatory Visit: Payer: Self-pay | Admitting: Physician Assistant

## 2017-06-14 ENCOUNTER — Encounter: Payer: Medicare Other | Attending: Psychology | Admitting: Psychology

## 2017-07-04 ENCOUNTER — Ambulatory Visit (INDEPENDENT_AMBULATORY_CARE_PROVIDER_SITE_OTHER): Payer: Medicare Other | Admitting: Physician Assistant

## 2017-07-04 ENCOUNTER — Encounter: Payer: Self-pay | Admitting: Physician Assistant

## 2017-07-04 VITALS — BP 139/80 | HR 63 | Ht 65.0 in | Wt 166.0 lb

## 2017-07-04 DIAGNOSIS — R011 Cardiac murmur, unspecified: Secondary | ICD-10-CM | POA: Diagnosis not present

## 2017-07-04 DIAGNOSIS — Z23 Encounter for immunization: Secondary | ICD-10-CM

## 2017-07-04 DIAGNOSIS — G3184 Mild cognitive impairment, so stated: Secondary | ICD-10-CM | POA: Diagnosis not present

## 2017-07-04 DIAGNOSIS — K130 Diseases of lips: Secondary | ICD-10-CM | POA: Diagnosis not present

## 2017-07-04 DIAGNOSIS — R413 Other amnesia: Secondary | ICD-10-CM

## 2017-07-04 DIAGNOSIS — E039 Hypothyroidism, unspecified: Secondary | ICD-10-CM

## 2017-07-04 LAB — TSH: TSH: 3.43 m[IU]/L (ref 0.40–4.50)

## 2017-07-04 MED ORDER — SYNTHROID 100 MCG PO TABS: ORAL_TABLET | ORAL | 0 refills | Status: DC

## 2017-07-04 MED ORDER — CEPHALEXIN 500 MG PO CAPS: 500.0000 mg | ORAL_CAPSULE | Freq: Two times a day (BID) | ORAL | 0 refills | Status: DC

## 2017-07-04 MED ORDER — DONEPEZIL HCL 10 MG PO TABS: 10.0000 mg | ORAL_TABLET | Freq: Every evening | ORAL | 0 refills | Status: DC | PRN

## 2017-07-04 NOTE — Patient Instructions (Signed)
Will get echo.  Increase to 1 full tablet of aricept.  Keflex for 7 days day.

## 2017-07-04 NOTE — Progress Notes (Signed)
Subjective:    Patient ID: Madison Rollins, female    DOB: 06/16/1928, 83 y.o.   MRN: [id number]  HPI Pt is a 83 yo female who presents to the clinic to follow up on aricept. She is taking 1/2 tablet because she was unsure about taking the medication. She has had no side effects. Her daughter feels like it has helped with her memory. They would like to go up to full dose.   She needs refill on thyroid medication. She is taking medication every day.   Her upper lip is swollen and tender ever since she had her teeth cleaned. She has hx of fever blister but no vesicle has popped up.    .. Active Ambulatory Problems    Diagnosis Date Noted  . Acute right-sided low back pain without sciatica 11/16/2016  . Right hip pain 11/16/2016  . Fall 11/16/2016  . Pleural effusion on left 12/01/2016  . COPD (chronic obstructive pulmonary disease) with chronic bronchitis ([place]) 12/17/2016  . Memory changes 12/17/2016  . Mild cognitive impairment 12/18/2016  . Right inguinal hernia 01/14/2017  . History of UTI 01/14/2017  . Right lower quadrant abdominal pain 01/14/2017  . Confusion 04/04/2017  . Systolic ejection murmur 89/38/1017  . Hypothyroidism 07/06/2017  . History of recurrent UTIs 07/06/2017   Resolved Ambulatory Problems    Diagnosis Date Noted  . Right flank pain 01/14/2017   Past Medical History:  Diagnosis Date  . Cancer ([place])   . Frequency of urination   . Hyperlipidemia   . Hypertension   . Thyroid disease       Review of Systems  All other systems reviewed and are negative.      Objective:   Physical Exam  Constitutional: She is oriented to person, place, and time. She appears well-developed and well-nourished.  HENT:  Head: Normocephalic and atraumatic.  Swollen upper lip with tenderness to palpation and some mild erythema.   Eyes: Conjunctivae are normal. Right eye exhibits no discharge. Left eye exhibits no discharge.  Neck: Normal range of motion. Neck supple.   Cardiovascular: Normal rate and regular rhythm.   Murmur heard. 2/6 SEM heard best over 2nd intercostal space on right side.   Pulmonary/Chest: Effort normal and breath sounds normal. She has no wheezes.  Lymphadenopathy:    She has no cervical adenopathy.  Neurological: She is alert and oriented to person, place, and time.  Psychiatric: She has a normal mood and affect. Her behavior is normal.          Assessment & Plan:  Marland KitchenMarland KitchenDiagnoses and all orders for this visit:  Mild cognitive impairment -     donepezil (ARICEPT) 10 MG tablet; Take 1 tablet (10 mg total) by mouth at bedtime as needed.  Influenza vaccine needed -     Flu Vaccine QUAD 6+ mos PF IM (Fluarix Quad PF)  Systolic ejection murmur -     ECHOCARDIOGRAM COMPLETE; Future -     ECHOCARDIOGRAM COMPLETE  Hypothyroidism, unspecified type -     TSH -     SYNTHROID 100 MCG tablet; TAKE 1 TABLET BY MOUTH EVERY DAY BEFORE BREAKFAST  Memory changes -     donepezil (ARICEPT) 10 MG tablet; Take 1 tablet (10 mg total) by mouth at bedtime as needed.  Cellulitis of skin of lip -     cephALEXin (KEFLEX) 500 MG capsule; Take 1 capsule (500 mg total) by mouth 2 (two) times daily. For 7 days.   Increase aricept up  to full tablet. Follow up in 3 months.   Will check TSH and adjust accordingly.   New murmur heard today. Denies any SOB or edema. Will get echo.   Upper lip cellulitis. Will treat with keflex and cool compresses.

## 2017-07-06 ENCOUNTER — Encounter: Payer: Self-pay | Admitting: Physician Assistant

## 2017-07-06 ENCOUNTER — Telehealth: Payer: Self-pay | Admitting: Physician Assistant

## 2017-07-06 DIAGNOSIS — F05 Delirium due to known physiological condition: Secondary | ICD-10-CM

## 2017-07-06 DIAGNOSIS — E039 Hypothyroidism, unspecified: Secondary | ICD-10-CM | POA: Insufficient documentation

## 2017-07-06 DIAGNOSIS — R011 Cardiac murmur, unspecified: Secondary | ICD-10-CM | POA: Insufficient documentation

## 2017-07-06 DIAGNOSIS — Z87440 Personal history of urinary (tract) infections: Secondary | ICD-10-CM | POA: Insufficient documentation

## 2017-07-06 NOTE — Telephone Encounter (Signed)
Please print echo and given to cindy. I can not print from home.

## 2017-07-08 DIAGNOSIS — F05 Delirium due to known physiological condition: Secondary | ICD-10-CM | POA: Insufficient documentation

## 2017-07-08 NOTE — Telephone Encounter (Signed)
Pt's daughter comes in with concerns. Patient's mood and confusion is worsening in the evenings after 5pm. She just went up to 6full tablet of aricept. Told to follow up in 3 weeks and consider adding namenda. Given pts daughter handout on things she can do to help with sundowners syndrome. Encouraged caregiver to join support group.

## 2017-07-29 ENCOUNTER — Ambulatory Visit (INDEPENDENT_AMBULATORY_CARE_PROVIDER_SITE_OTHER): Payer: Medicare Other | Admitting: Physician Assistant

## 2017-07-29 VITALS — BP 140/73 | HR 57 | Wt 164.0 lb

## 2017-07-29 DIAGNOSIS — F05 Delirium due to known physiological condition: Secondary | ICD-10-CM | POA: Diagnosis not present

## 2017-07-29 DIAGNOSIS — K1379 Other lesions of oral mucosa: Secondary | ICD-10-CM | POA: Diagnosis not present

## 2017-07-29 DIAGNOSIS — E559 Vitamin D deficiency, unspecified: Secondary | ICD-10-CM | POA: Diagnosis not present

## 2017-07-29 DIAGNOSIS — G3184 Mild cognitive impairment, so stated: Secondary | ICD-10-CM | POA: Diagnosis not present

## 2017-07-29 DIAGNOSIS — R413 Other amnesia: Secondary | ICD-10-CM

## 2017-07-29 MED ORDER — NYSTATIN 100000 UNIT/ML MT SUSP: 500000.0000 [IU] | Freq: Four times a day (QID) | OROMUCOSAL | 0 refills | Status: DC

## 2017-07-29 MED ORDER — VITAMIN D (ERGOCALCIFEROL) 1.25 MG (50000 UNIT) PO CAPS: 50000.0000 [IU] | ORAL_CAPSULE | ORAL | 5 refills | Status: AC

## 2017-07-29 NOTE — Progress Notes (Signed)
Subjective:    Patient ID: Madison Rollins, female    DOB: 08-23-1928, 83 y.o.   MRN: [id number]  HPI  Pt is a 83 yo pleasant female who presents to the clinic with her daughter to follow up on mild cognitive impairment. She recently increased to full tablet of aricept at 10mg  daily. Both daughter and patient feel like patient is doing much better. Still has worsening memory and mood at night. She does get upset that she feels like "she has no control over anything". She request to be able to drive again.   She does have some mouth pain and irritation.her lips are also very irritated and cracking. I do not see any discharge. She has had a few recent abx. Her mouth is also very dry. She has not tried anything to make better except blistex on lips.   .. Active Ambulatory Problems    Diagnosis Date Noted  . Acute right-sided low back pain without sciatica 11/16/2016  . Right hip pain 11/16/2016  . Fall 11/16/2016  . Pleural effusion on left 12/01/2016  . COPD (chronic obstructive pulmonary disease) with chronic bronchitis ([place]) 12/17/2016  . Memory changes 12/17/2016  . Mild cognitive impairment 12/18/2016  . Right inguinal hernia 01/14/2017  . History of UTI 01/14/2017  . Right lower quadrant abdominal pain 01/14/2017  . Confusion 04/04/2017  . Systolic ejection murmur 21/19/4174  . Hypothyroidism 07/06/2017  . History of recurrent UTIs 07/06/2017  . Sundowning 07/08/2017   Resolved Ambulatory Problems    Diagnosis Date Noted  . Right flank pain 01/14/2017   Past Medical History:  Diagnosis Date  . Cancer ([place])   . Frequency of urination   . Hyperlipidemia   . Hypertension   . Thyroid disease            Review of Systems  All other systems reviewed and are negative.      Objective:   Physical Exam  Constitutional: She is oriented to person, place, and time. She appears well-developed and well-nourished.  HENT:  Head: Normocephalic and atraumatic.  Dry cracked  lips.  Erythematous buccal mucosa.  No leukoplakia.  Tender to inspection with tongue depressor.   Cardiovascular: Normal rate, regular rhythm and normal heart sounds.   Pulmonary/Chest: Effort normal and breath sounds normal. She has no wheezes.  Neurological: She is alert and oriented to person, place, and time.  Psychiatric: She has a normal mood and affect. Her behavior is normal.          Assessment & Plan:  Marland KitchenMarland KitchenPearl was seen today for follow-up.  Diagnoses and all orders for this visit:  Mild cognitive impairment  Sundowning  Memory changes  Mouth pain -     nystatin (MYCOSTATIN) 100000 UNIT/ML suspension; Take 5 mLs (500,000 Units total) by mouth 4 (four) times daily. Swish for 30 seconds and spit out.  Vitamin D deficiency -     Vitamin D, Ergocalciferol, (DRISDOL) 50000 units CAPS capsule; Take 1 capsule (50,000 Units total) by mouth every 7 (seven) days.    .. MMSE - Mini Mental State Exam 07/29/2017 05/16/2017  Orientation to time 5 5  Orientation to Place 5 3  Registration 3 3  Attention/ Calculation 5 0  Recall 3 3  Language- name 2 objects 2 2  Language- repeat 1 1  Language- follow 3 step command 3 3  Language- read & follow direction 1 1  Write a sentence 1 1  Copy design 0 1  Total score 29  23    MMSE improved. Stay on aricept 10mg  daily.  I do not recommend giving patient back ability to drive. I think she would put herself and others at risk.  I did discuss giving her power/desicion making over something  Even if it is grocery shopping. Pt is in agreement.   Use vasoline for lips. Possible irritation from dryness or thrush due to antibiotics. Given nystatin mouth wash. If not improving try biotene. Follow up as needed.   Follow up in 3 months.   .Spent 30 minutes with patient and greater than 50 percent of visit spent counseling patient regarding treatment plan.

## 2017-08-03 ENCOUNTER — Encounter: Payer: Self-pay | Admitting: Physician Assistant

## 2017-08-03 ENCOUNTER — Ambulatory Visit (HOSPITAL_BASED_OUTPATIENT_CLINIC_OR_DEPARTMENT_OTHER)
Admission: RE | Admit: 2017-08-03 | Discharge: 2017-08-03 | Disposition: A | Discharge status: Home / Self Care | Payer: Medicare Other | Source: Ambulatory Visit | Attending: Physician Assistant | Admitting: Physician Assistant

## 2017-08-03 DIAGNOSIS — R011 Cardiac murmur, unspecified: Secondary | ICD-10-CM | POA: Diagnosis present

## 2017-08-03 DIAGNOSIS — I351 Nonrheumatic aortic (valve) insufficiency: Secondary | ICD-10-CM | POA: Insufficient documentation

## 2017-08-03 NOTE — Progress Notes (Signed)
  Echocardiogram 2D Echocardiogram has been performed.  Tresa Res 08/03/2017, 10:28 AM

## 2017-08-15 ENCOUNTER — Other Ambulatory Visit: Payer: Self-pay

## 2017-08-15 NOTE — Telephone Encounter (Signed)
Patient needs a refill on Metoprolol 25 mg. Historical provider.

## 2017-08-16 MED ORDER — METOPROLOL TARTRATE 25 MG PO TABS: 25.0000 mg | ORAL_TABLET | Freq: Every day | ORAL | 5 refills | Status: DC

## 2017-08-16 NOTE — Telephone Encounter (Signed)
Route to [place]

## 2017-09-01 ENCOUNTER — Ambulatory Visit: Payer: Medicare Other | Admitting: Physician Assistant

## 2017-10-07 ENCOUNTER — Encounter: Payer: Self-pay | Admitting: Physician Assistant

## 2017-10-07 ENCOUNTER — Ambulatory Visit (INDEPENDENT_AMBULATORY_CARE_PROVIDER_SITE_OTHER): Payer: Medicare Other | Admitting: Physician Assistant

## 2017-10-07 VITALS — BP 142/60 | HR 64 | Ht 65.0 in | Wt 163.0 lb

## 2017-10-07 DIAGNOSIS — R413 Other amnesia: Secondary | ICD-10-CM

## 2017-10-07 DIAGNOSIS — F05 Delirium due to known physiological condition: Secondary | ICD-10-CM

## 2017-10-07 DIAGNOSIS — G3184 Mild cognitive impairment, so stated: Secondary | ICD-10-CM

## 2017-10-07 DIAGNOSIS — E039 Hypothyroidism, unspecified: Secondary | ICD-10-CM | POA: Diagnosis not present

## 2017-10-07 DIAGNOSIS — R454 Irritability and anger: Secondary | ICD-10-CM

## 2017-10-07 LAB — TSH: TSH: 1.13 m[IU]/L (ref 0.40–4.50)

## 2017-10-07 MED ORDER — METOPROLOL TARTRATE 25 MG PO TABS: 25.0000 mg | ORAL_TABLET | Freq: Every day | ORAL | 1 refills | Status: DC

## 2017-10-07 MED ORDER — DONEPEZIL HCL 10 MG PO TABS: 10.0000 mg | ORAL_TABLET | Freq: Every day | ORAL | 1 refills | Status: DC

## 2017-10-07 MED ORDER — SERTRALINE HCL 25 MG PO TABS: 25.0000 mg | ORAL_TABLET | Freq: Every day | ORAL | 1 refills | Status: DC

## 2017-10-07 MED ORDER — AMBULATORY NON FORMULARY MEDICATION: 0 refills | Status: DC

## 2017-10-07 NOTE — Patient Instructions (Signed)
Get scheduled for driving assessment.  Start zoloft once daily in morning.  Recheck thyroid.

## 2017-10-07 NOTE — Progress Notes (Signed)
   Subjective:    Patient ID: Madison Rollins, female    DOB: 12-Oct-1927, 84 y.o.   MRN: [id number]  HPI Pt is a 84 yo pleasant female with mild cognitive impairment who presents to the clinic for follow up. She is accompanied by her daughter.   She is on aricept and doing well. As of last visit her MMSE was back up to 29.  She really wants to drive again. Per patient just to grocery store and her friends home. Daughter and family are concerned.   Hypothyroidism- needs labs. Last recheck was elevated.   .. Active Ambulatory Problems    Diagnosis Date Noted  . Acute right-sided low back pain without sciatica 11/16/2016  . Right hip pain 11/16/2016  . Fall 11/16/2016  . Pleural effusion on left 12/01/2016  . COPD (chronic obstructive pulmonary disease) with chronic bronchitis ([place]) 12/17/2016  . Memory changes 12/17/2016  . Mild cognitive impairment 12/18/2016  . Right inguinal hernia 01/14/2017  . History of UTI 01/14/2017  . Right lower quadrant abdominal pain 01/14/2017  . Confusion 04/04/2017  . Systolic ejection murmur 27/03/2375  . Hypothyroidism 07/06/2017  . History of recurrent UTIs 07/06/2017  . Sundowning 07/08/2017  . Aortic regurgitation 08/03/2017   Resolved Ambulatory Problems    Diagnosis Date Noted  . Right flank pain 01/14/2017   Past Medical History:  Diagnosis Date  . Cancer ([place])   . Frequency of urination   . Hyperlipidemia   . Hypertension   . Thyroid disease        Review of Systems    see HPI.  Objective:   Physical Exam  Constitutional: She is oriented to person, place, and time. She appears well-developed and well-nourished.  HENT:  Head: Normocephalic and atraumatic.  Cardiovascular: Normal rate, regular rhythm and normal heart sounds.  Pulmonary/Chest: Effort normal and breath sounds normal. She has no wheezes.  Neurological: She is alert and oriented to person, place, and time.  Psychiatric: She has a normal mood and affect. Her  behavior is normal.          Assessment & Plan:  Marland KitchenMarland KitchenPearl was seen today for hypertension and hypothyroidism.  Diagnoses and all orders for this visit:  Mild cognitive impairment -     donepezil (ARICEPT) 10 MG tablet; Take 1 tablet (10 mg total) by mouth at bedtime.  Memory changes -     donepezil (ARICEPT) 10 MG tablet; Take 1 tablet (10 mg total) by mouth at bedtime.  Acquired hypothyroidism -     TSH  Sundowning -     sertraline (ZOLOFT) 25 MG tablet; Take 1 tablet (25 mg total) by mouth daily.  Irritable -     sertraline (ZOLOFT) 25 MG tablet; Take 1 tablet (25 mg total) by mouth daily.  Other orders -     metoprolol tartrate (LOPRESSOR) 25 MG tablet; Take 1 tablet (25 mg total) by mouth daily. -     AMBULATORY NON FORMULARY MEDICATION; Driver Rehabilitation Evaluation.   Follow up in 4-6 weeks on zoloft. Discussed side effects.   Recheck TSH and adjust accordingly.   I personally am on the fence about giving her permission to drive again. In the office I feel she could drive to grocery store but I am concerned about the evening "sundowning" as well as her reaction time and functional turning of head.   I am going to send her to a drivers rehabilitation to evaluate if she meets requirements to drive.

## 2017-10-10 ENCOUNTER — Other Ambulatory Visit: Payer: Self-pay | Admitting: *Deleted

## 2017-10-10 DIAGNOSIS — E039 Hypothyroidism, unspecified: Secondary | ICD-10-CM

## 2017-10-10 DIAGNOSIS — R454 Irritability and anger: Secondary | ICD-10-CM | POA: Insufficient documentation

## 2017-10-10 MED ORDER — SYNTHROID 100 MCG PO TABS: ORAL_TABLET | ORAL | 1 refills | Status: DC

## 2017-10-31 ENCOUNTER — Ambulatory Visit: Payer: Medicare Other | Admitting: Physician Assistant

## 2017-10-31 DIAGNOSIS — Z0189 Encounter for other specified special examinations: Secondary | ICD-10-CM

## 2017-11-07 ENCOUNTER — Telehealth: Payer: Self-pay | Admitting: Physician Assistant

## 2017-11-07 MED ORDER — METOPROLOL TARTRATE 25 MG PO TABS: 25.0000 mg | ORAL_TABLET | Freq: Every day | ORAL | 1 refills | Status: DC

## 2017-11-07 NOTE — Telephone Encounter (Signed)
Will route to PCP for review on if new Rx should be sent in now, or wait until upcoming appt on 11/09/17.

## 2017-11-07 NOTE — Telephone Encounter (Signed)
I sent refill. Keep 2/6 appt.

## 2017-11-07 NOTE — Telephone Encounter (Signed)
Pt's daughter called adv that pt is out of her metoprolol and she needs a refill, daughter think pt is taking her meds twice by error. Pt is scheduled for Wed 2/6 at 8:30

## 2017-11-09 ENCOUNTER — Ambulatory Visit (INDEPENDENT_AMBULATORY_CARE_PROVIDER_SITE_OTHER): Payer: Medicare Other | Admitting: Physician Assistant

## 2017-11-09 ENCOUNTER — Encounter: Payer: Self-pay | Admitting: Physician Assistant

## 2017-11-09 ENCOUNTER — Other Ambulatory Visit: Payer: Self-pay | Admitting: Physician Assistant

## 2017-11-09 VITALS — BP 124/73 | HR 55 | Ht 65.0 in | Wt 162.0 lb

## 2017-11-09 DIAGNOSIS — F05 Delirium due to known physiological condition: Secondary | ICD-10-CM | POA: Diagnosis not present

## 2017-11-09 DIAGNOSIS — R454 Irritability and anger: Secondary | ICD-10-CM

## 2017-11-09 DIAGNOSIS — R002 Palpitations: Secondary | ICD-10-CM | POA: Diagnosis not present

## 2017-11-09 DIAGNOSIS — J449 Chronic obstructive pulmonary disease, unspecified: Secondary | ICD-10-CM | POA: Diagnosis not present

## 2017-11-09 DIAGNOSIS — G3184 Mild cognitive impairment, so stated: Secondary | ICD-10-CM

## 2017-11-09 DIAGNOSIS — E039 Hypothyroidism, unspecified: Secondary | ICD-10-CM

## 2017-11-09 DIAGNOSIS — J4489 Other specified chronic obstructive pulmonary disease: Secondary | ICD-10-CM

## 2017-11-09 MED ORDER — SERTRALINE HCL 25 MG PO TABS: 25.0000 mg | ORAL_TABLET | Freq: Every day | ORAL | 1 refills | Status: DC

## 2017-11-09 MED ORDER — MONTELUKAST SODIUM 10 MG PO TABS: 10.0000 mg | ORAL_TABLET | Freq: Every day | ORAL | 3 refills | Status: DC

## 2017-11-09 NOTE — Patient Instructions (Signed)
Palpitations A palpitation is the feeling that your heart:  Has an uneven (irregular) heartbeat.  Is beating faster than normal.  Is fluttering.  Is skipping a beat.  This is usually not a serious problem. In some cases, you may need more medical tests. Follow these instructions at home:  Avoid: ? Caffeine in coffee, tea, soft drinks, diet pills, and energy drinks. ? Chocolate. ? Alcohol.  Do not use any tobacco products. These include cigarettes, chewing tobacco, and e-cigarettes. If you need help quitting, ask your doctor.  Try to reduce your stress. These things may help: ? Yoga. ? Meditation. ? Physical activity. Swimming, jogging, and walking are good choices. ? A method that helps you use your mind to control things in your body, like heartbeats (biofeedback).  Get plenty of rest and sleep.  Take over-the-counter and prescription medicines only as told by your doctor.  Keep all follow-up visits as told by your doctor. This is important. Contact a doctor if:  Your heartbeat is still fast or uneven after 24 hours.  Your palpitations occur more often. Get help right away if:  You have chest pain.  You feel short of breath.  You have a very bad headache.  You feel dizzy.  You pass out (faint). This information is not intended to replace advice given to you by your health care provider. Make sure you discuss any questions you have with your health care provider. Document Released: 06/29/2008 Document Revised: 02/26/2016 Document Reviewed: 06/05/2015 Elsevier Interactive Patient Education  2018 Elsevier Inc.  

## 2017-11-09 NOTE — Progress Notes (Signed)
Subjective:    Patient ID: Madison Rollins, female    DOB: 1928-03-12, 84 y.o.   MRN: [id number]  HPI  Patient is an 84 year old female with mild cognitive impairment, COPD, hypertension who presents to the clinic to follow on adding Zoloft for irritability and sundowning.  She presents with her daughter.  Overall patient has not noticed any benefit or side effects.  Daughter feels like she has improved significantly.  They like the current dose of Zoloft.  She feels like she is much more happy and less argumentative.  Overall she is doing very well.  Patient does mention occasional palpitations.  This is only happened a few times.  She is taking metoprolol once a day.  She thinks she could have been taking it incorrectly at one point.  Her daughter is now a control of her medications and places them in a pill bottle.  She admits to drinking a lot of caffeine and not a lot of water.  She denies any chest pains.  The palpitations go away as quickly as they come.  COPD- she is not taking anoro. Daughter ask if there are any pills she can take to help with lung function. She is not usuing rescue inhalers as well.   .. Active Ambulatory Problems    Diagnosis Date Noted  . Acute right-sided low back pain without sciatica 11/16/2016  . Right hip pain 11/16/2016  . Fall 11/16/2016  . COPD (chronic obstructive pulmonary disease) with chronic bronchitis ([place]) 12/17/2016  . Memory changes 12/17/2016  . Mild cognitive impairment 12/18/2016  . Right inguinal hernia 01/14/2017  . History of UTI 01/14/2017  . Right lower quadrant abdominal pain 01/14/2017  . Confusion 04/04/2017  . Systolic ejection murmur 42/70/6237  . Hypothyroidism 07/06/2017  . History of recurrent UTIs 07/06/2017  . Sundowning 07/08/2017  . Aortic regurgitation 08/03/2017  . Irritable 10/10/2017   Resolved Ambulatory Problems    Diagnosis Date Noted  . Pleural effusion on left 12/01/2016  . Right flank pain 01/14/2017   Past  Medical History:  Diagnosis Date  . Cancer ([place])   . Frequency of urination   . Hyperlipidemia   . Hypertension   . Thyroid disease        Review of Systems  All other systems reviewed and are negative.      Objective:   Physical Exam  Constitutional: She is oriented to person, place, and time. She appears well-developed and well-nourished.  HENT:  Head: Normocephalic and atraumatic.  Cardiovascular: Normal rate, regular rhythm and normal heart sounds.  Pulmonary/Chest: Effort normal and breath sounds normal.  Neurological: She is alert and oriented to person, place, and time.  Psychiatric: She has a normal mood and affect. Her behavior is normal.          Assessment & Plan:  Marland KitchenMarland KitchenDiagnoses and all orders for this visit:  COPD (chronic obstructive pulmonary disease) with chronic bronchitis ([facility]) -     montelukast (SINGULAIR) 10 MG tablet; Take 1 tablet (10 mg total) by mouth at bedtime.  Sundowning -     sertraline (ZOLOFT) 25 MG tablet; Take 1 tablet (25 mg total) by mouth daily.  Irritable -     sertraline (ZOLOFT) 25 MG tablet; Take 1 tablet (25 mg total) by mouth daily.  Mild cognitive impairment  Palpitations   Pt is a doing much better on zoloft. Refilled for 6 months.  Continue on aricept.   Discussed causes of palpitations. It could certainly be caffeine and  not adequately hydrated. Discussed limiting caffiene. HO given on other causes. If continues will get holter monitor. Let me know if lasting minutes at a time instead of briefly. Confirm taking metoprolol once a day. HR low today and BP perfect.   Strongly encouraged anoro. If will not take could try singulair daily. Follow up in 6 months.

## 2017-11-21 ENCOUNTER — Other Ambulatory Visit: Payer: Self-pay | Admitting: Physician Assistant

## 2017-11-21 DIAGNOSIS — G3184 Mild cognitive impairment, so stated: Secondary | ICD-10-CM

## 2017-11-21 DIAGNOSIS — R413 Other amnesia: Secondary | ICD-10-CM

## 2017-11-21 MED ORDER — METOPROLOL TARTRATE 25 MG PO TABS: 25.0000 mg | ORAL_TABLET | Freq: Every day | ORAL | 1 refills | Status: DC

## 2017-11-21 MED ORDER — VESICARE 5 MG PO TABS: 5.0000 mg | ORAL_TABLET | Freq: Every day | ORAL | 1 refills | Status: DC

## 2017-11-21 MED ORDER — DONEPEZIL HCL 10 MG PO TABS: 10.0000 mg | ORAL_TABLET | Freq: Every day | ORAL | 1 refills | Status: DC

## 2017-11-21 NOTE — Telephone Encounter (Signed)
Needs a refill on Vesicare. Historical provider.

## 2017-11-21 NOTE — Telephone Encounter (Signed)
Call patient's daughter because she is taking care of her mom and has a question about her meds. Thanks

## 2017-11-29 ENCOUNTER — Ambulatory Visit (INDEPENDENT_AMBULATORY_CARE_PROVIDER_SITE_OTHER): Payer: Medicare Other | Admitting: Physician Assistant

## 2017-11-29 DIAGNOSIS — F05 Delirium due to known physiological condition: Secondary | ICD-10-CM | POA: Diagnosis not present

## 2017-11-29 DIAGNOSIS — G3184 Mild cognitive impairment, so stated: Secondary | ICD-10-CM | POA: Diagnosis not present

## 2017-11-29 DIAGNOSIS — Z710 Person encountering health services to consult on behalf of another person: Secondary | ICD-10-CM

## 2017-11-29 DIAGNOSIS — R454 Irritability and anger: Secondary | ICD-10-CM | POA: Diagnosis not present

## 2017-11-29 NOTE — Progress Notes (Signed)
Subjective:    Patient ID: Stefan Church, female    DOB: [birth year], 85 y.o.   MRN: [id number]  HPI  Patient is an 84 year old female that is not present today.  It is her family who comes in to discuss her care.  It is her daughter and granddaughter.  They feel like it may be time to transition into a memory care unit.  They feel that it is harder and harder to keep her safe at home without continual monitoring.  She is becoming more and more forgetful, irritable, responsible.  Just recently she left the water alone in [place] and a whole room.  She often forgets to take her medications in when she remembers takes them wrong.  She becomes very aggressive with family even when they are trying to help her.  She also becomes very aloof of what is going on. Her daughter is trying to take care of her the best she can but has missed multiple days of work because she feels like she cannot leave her mother along.  She also recently left 2 stove burners running but luckly caught before caused any problems.  Her symptoms she is to be relatively all in the evening with her sundowning.  Now the symptoms seem to be happening more throughout the day.  She does not want to leave the house or go anywhere.  She really does not want to eat.  Family would like her in a place that she can have more of a support system, persons could monitor her behavior, help her with bathing, administer medicines, have 3 good meals a day, and have activities.  She has gotten some brochures on [facility].  .. Active Ambulatory Problems    Diagnosis Date Noted  . Acute right-sided low back pain without sciatica 11/16/2016  . Right hip pain 11/16/2016  . Fall 11/16/2016  . COPD (chronic obstructive pulmonary disease) with chronic bronchitis ([place]) 12/17/2016  . Memory changes 12/17/2016  . Mild cognitive impairment 12/18/2016  . Right inguinal hernia 01/14/2017  . History of UTI 01/14/2017  . Right lower quadrant abdominal pain  01/14/2017  . Confusion 04/04/2017  . Systolic ejection murmur 36/14/4315  . Hypothyroidism 07/06/2017  . History of recurrent UTIs 07/06/2017  . Sundowning 07/08/2017  . Aortic regurgitation 08/03/2017  . Irritable 10/10/2017  . Palpitations 11/09/2017   Resolved Ambulatory Problems    Diagnosis Date Noted  . Pleural effusion on left 12/01/2016  . Right flank pain 01/14/2017   Past Medical History:  Diagnosis Date  . Cancer ([place])   . Frequency of urination   . Hyperlipidemia   . Hypertension   . Thyroid disease        Review of Systems NOT DONE    Objective:   Physical Exam NOT DONE as patient was not present.        Assessment & Plan:  Marland KitchenMarland KitchenDiagnoses and all orders for this visit:  Encounter for family conference without patient present  Mild cognitive impairment  Irritable  Sundowning   I certainly support family's decision that patient needs more care and monitoring.  Certainly the times I have seen her she is always been able to pass the Mini-Mental status but it seems like her cognitive function is declining.  I offered to add Namenda to the Aricept; however, patient might not even be taking the Aricept if she is taking her medications correctly.  I also offered a neurology referral.  At this time patient family declined.  I  encouraged them to do the research and find out what Medicare will pay for.  We will continue paperwork but I could help him out with to get them registered and set up with a nursing home.  I spent a lot of time counseling her daughter and granddaughter not feel guilty for these decisions.  Certainly they feel like it is their duty to take full responsibility for their mother and grandmother but they do not feel like this is possible in her life right now.  Ongoing to try to find a list of support groups for people whose parents are affected by dementia or Alzheimer's.  Marland Kitchen.Spent 30 minutes with patient and greater than 50 percent of visit  spent counseling patient regarding treatment plan.

## 2017-11-30 ENCOUNTER — Telehealth: Payer: Self-pay | Admitting: Physician Assistant

## 2017-11-30 NOTE — Telephone Encounter (Signed)
Hey can we call around to a neurologist office or even the memory care rehab and see what resources they have for family member effected by alzhiemers/dementia? This patients daughter was interested. Please just send back to me and I will get info to daughter, Thanks.

## 2017-12-23 ENCOUNTER — Ambulatory Visit: Payer: Medicare Other | Admitting: Physician Assistant

## 2017-12-23 DIAGNOSIS — Z0189 Encounter for other specified special examinations: Secondary | ICD-10-CM

## 2017-12-27 ENCOUNTER — Encounter: Payer: Self-pay | Admitting: Physician Assistant

## 2017-12-27 ENCOUNTER — Ambulatory Visit (INDEPENDENT_AMBULATORY_CARE_PROVIDER_SITE_OTHER): Payer: Medicare Other | Admitting: Physician Assistant

## 2017-12-27 VITALS — BP 145/78 | HR 57 | Ht 65.0 in | Wt 156.0 lb

## 2017-12-27 DIAGNOSIS — G3184 Mild cognitive impairment, so stated: Secondary | ICD-10-CM | POA: Diagnosis not present

## 2017-12-27 DIAGNOSIS — Z79899 Other long term (current) drug therapy: Secondary | ICD-10-CM

## 2017-12-27 DIAGNOSIS — I351 Nonrheumatic aortic (valve) insufficiency: Secondary | ICD-10-CM

## 2017-12-27 DIAGNOSIS — B372 Candidiasis of skin and nail: Secondary | ICD-10-CM | POA: Diagnosis not present

## 2017-12-27 DIAGNOSIS — R600 Localized edema: Secondary | ICD-10-CM | POA: Diagnosis not present

## 2017-12-27 DIAGNOSIS — R300 Dysuria: Secondary | ICD-10-CM

## 2017-12-27 DIAGNOSIS — N3001 Acute cystitis with hematuria: Secondary | ICD-10-CM

## 2017-12-27 LAB — POCT URINALYSIS DIPSTICK
Bilirubin, UA: NEGATIVE
Glucose, UA: NEGATIVE
Ketones, UA: NEGATIVE
NITRITE UA: POSITIVE
PH UA: 6.5 (ref 5.0–8.0)
PROTEIN UA: NEGATIVE
UROBILINOGEN UA: 0.2 U/dL

## 2017-12-27 MED ORDER — SULFAMETHOXAZOLE-TRIMETHOPRIM 800-160 MG PO TABS: 1.0000 | ORAL_TABLET | Freq: Two times a day (BID) | ORAL | 0 refills | Status: DC

## 2017-12-27 MED ORDER — CLOTRIMAZOLE-BETAMETHASONE 1-0.05 % EX CREA: 1.0000 "application " | TOPICAL_CREAM | Freq: Two times a day (BID) | CUTANEOUS | 1 refills | Status: AC

## 2017-12-27 NOTE — Patient Instructions (Signed)

## 2017-12-27 NOTE — Progress Notes (Signed)
Subjective:    Patient ID: Madison Rollins, female    DOB: 18-May-1928, 84 y.o.   MRN: [id number]  HPI Pt is a 84 yo female with mild cognitive impairment and aortic regurgitation who presents to the clinic with dysuria and urine odor. Accompanied by grandson and daughter. Concerned for UTI. Pt denies any fever, chills, body aches, flank pain. Not tried anything to make better. Symptoms present for last 2-3 days.   She also has a red itchy, irritated rash in bilateral groin and under abdominal fold. She has been using destin which helps a lot.   Patient's daughter is concerned about intermittent swelling of ankles and around eyes. Pt denies any SOB, PND, orhopnea, persistent edema. Pt does have hx of aortic regurg.   .. Active Ambulatory Problems    Diagnosis Date Noted  . Acute right-sided low back pain without sciatica 11/16/2016  . Right hip pain 11/16/2016  . Fall 11/16/2016  . COPD (chronic obstructive pulmonary disease) with chronic bronchitis ([place]) 12/17/2016  . Memory changes 12/17/2016  . Mild cognitive impairment 12/18/2016  . Right inguinal hernia 01/14/2017  . History of UTI 01/14/2017  . Right lower quadrant abdominal pain 01/14/2017  . Confusion 04/04/2017  . Systolic ejection murmur 07/28/8526  . Hypothyroidism 07/06/2017  . History of recurrent UTIs 07/06/2017  . Sundowning 07/08/2017  . Aortic regurgitation 08/03/2017  . Irritable 10/10/2017  . Palpitations 11/09/2017  . Candidal intertrigo 12/27/2017  . Lower leg edema 12/27/2017   Resolved Ambulatory Problems    Diagnosis Date Noted  . Pleural effusion on left 12/01/2016  . Right flank pain 01/14/2017   Past Medical History:  Diagnosis Date  . Cancer ([place])   . Frequency of urination   . Hyperlipidemia   . Hypertension   . Thyroid disease    .Marland Kitchen Family History  Problem Relation Age of Onset  . Diabetes Brother   . Hyperlipidemia Mother   . Hypertension Mother   . Cancer Father        lung  . Cancer  Sister        breast      Review of Systems  All other systems reviewed and are negative.      Objective:   Physical Exam  Constitutional: She is oriented to person, place, and time. She appears well-developed and well-nourished.  HENT:  Head: Normocephalic and atraumatic.  Eyes:  Swollen around eyes.   Cardiovascular: Normal rate and regular rhythm.  Right 2nd intercostal space 3/6 systolic murmur.   Pulmonary/Chest: Effort normal and breath sounds normal. She has no wheezes.  NO CVA tenderness.   Abdominal: Soft. Bowel sounds are normal.  Suprapubic pressure.   Neurological: She is alert and oriented to person, place, and time.  Skin: Skin is dry. No rash noted.  Erythematous macular moist appearance in the abdominal panis and groin fold bilaterally.   Psychiatric: She has a normal mood and affect. Her behavior is normal.          Assessment & Plan:  Marland KitchenMarland KitchenDiagnoses and all orders for this visit:  Acute cystitis with hematuria -     sulfamethoxazole-trimethoprim (BACTRIM DS,SEPTRA DS) 800-160 MG tablet; Take 1 tablet by mouth 2 (two) times daily. For 7 days.  Dysuria -     POCT urinalysis dipstick  Medication management -     COMPLETE METABOLIC PANEL WITH GFR -     Brain natriuretic peptide  Lower leg edema -     COMPLETE METABOLIC PANEL WITH  GFR -     Brain natriuretic peptide  Nonrheumatic aortic valve insufficiency  Mild cognitive impairment  Candidal intertrigo -     clotrimazole-betamethasone (LOTRISONE) cream; Apply 1 application topically 2 (two) times daily.     .. Results for orders placed or performed in visit on 12/27/17  POCT urinalysis dipstick  Result Value Ref Range   Color, UA Orange    Clarity, UA Cloudy    Glucose, UA Negative    Bilirubin, UA Negative    Ketones, UA Negative    Spec Grav, UA >=1.030 (A) 1.010 - 1.025   Blood, UA Trace-intact    pH, UA 6.5 5.0 - 8.0   Protein, UA Negative    Urobilinogen, UA 0.2 0.2 or 1.0  E.U./dL   Nitrite, UA Positive    Leukocytes, UA Moderate (2+) (A) Negative   Appearance     Odor     Will treat with bactrim. Discussed symptomatic care of UTI.   lortisone cream as needed with destin for yeast rash in folds of skin.   Pt has some swollen around eyes and intermittent ankle edema. Recheck kidney function and BNP. Discussed compression stockings and keeping feet up. Last echo 07/2017.

## 2017-12-31 LAB — COMPLETE METABOLIC PANEL WITH GFR
AG Ratio: 1.6 (calc) (ref 1.0–2.5)
ALKALINE PHOSPHATASE (APISO): 75 U/L (ref 33–130)
ALT: 12 U/L (ref 6–29)
AST: 16 U/L (ref 10–35)
Albumin: 3.9 g/dL (ref 3.6–5.1)
BILIRUBIN TOTAL: 0.5 mg/dL (ref 0.2–1.2)
BUN/Creatinine Ratio: 17 (calc) (ref 6–22)
BUN: 19 mg/dL (ref 7–25)
CHLORIDE: 104 mmol/L (ref 98–110)
CO2: 28 mmol/L (ref 20–32)
Calcium: 9.7 mg/dL (ref 8.6–10.4)
Creat: 1.15 mg/dL — ABNORMAL HIGH (ref 0.60–0.88)
GFR, Est African American: 50 mL/min/{1.73_m2} — ABNORMAL LOW (ref >=60)
GFR, Est Non African American: 43 mL/min/{1.73_m2} — ABNORMAL LOW (ref >=60)
GLUCOSE: 85 mg/dL (ref 65–99)
Globulin: 2.5 g/dL (calc) (ref 1.9–3.7)
Potassium: 4.3 mmol/L (ref 3.5–5.3)
Sodium: 138 mmol/L (ref 135–146)
Total Protein: 6.4 g/dL (ref 6.1–8.1)

## 2017-12-31 LAB — BRAIN NATRIURETIC PEPTIDE: Brain Natriuretic Peptide: 47 pg/mL (ref <100)

## 2018-01-01 NOTE — Progress Notes (Signed)
Call pt: normal fluid level in body. Serum creatine up some. Make sure staying hydrated and NO advil, ibuprofen, aleve. Will recheck in 2-3 months.

## 2018-01-06 ENCOUNTER — Ambulatory Visit: Payer: Medicare Other | Admitting: Physician Assistant

## 2018-01-10 NOTE — Telephone Encounter (Signed)
[place] [phone]

## 2018-01-11 NOTE — Telephone Encounter (Signed)
Call pt's daughter Anderson Malta and see if they are interested in referral to memory care center?

## 2018-01-11 NOTE — Telephone Encounter (Signed)
Left VM for Pt's daughter.

## 2018-01-11 NOTE — Telephone Encounter (Signed)
Memory care is not a residence it is a clinic for more testing and resources for family.

## 2018-01-11 NOTE — Telephone Encounter (Signed)
Spoke with Pt's daughter. She does not want to put her mother in a memory care facility, but she will look into the facility to see if they have some family resources she would benefit from. Pt has an appt on Friday with PCP, so any further questions will be brought up then.

## 2018-01-13 ENCOUNTER — Other Ambulatory Visit: Payer: Self-pay | Admitting: Physician Assistant

## 2018-01-13 ENCOUNTER — Ambulatory Visit: Payer: Medicare Other | Admitting: Physician Assistant

## 2018-01-13 NOTE — Progress Notes (Unsigned)
CT 11/04/2016

## 2018-01-23 ENCOUNTER — Telehealth: Payer: Self-pay

## 2018-01-23 NOTE — Telephone Encounter (Signed)
PT's daughter Caro Laroche is okay with the medication Luvenia Starch is recommending to put patient on. There were 4 medications involved. Please send scripts into pharmacy on file.

## 2018-01-24 NOTE — Telephone Encounter (Signed)
This has been routed to Provider, unsure what medications are being requested. Will route also to a Provider in office to see if they can decipher.

## 2018-01-25 NOTE — Telephone Encounter (Signed)
I don't see anything specific in the most recent progress notes or other documentation. If they cannot give specific names of meds, not sure what to do here. Maybe Aricept for memory, Vesicare for bladder, Zoloft for depression, Singulair for allergies, Metoprolol for palpitations? Looks like an inhaler was discussed? Pharmacy might know, if anything was sent and cancelled?   Would recommend follow-up with Madison Rollins if we can't get more information and they should bring all home pill bottles to that visit to confirm their meds align with our list.

## 2018-01-26 NOTE — Telephone Encounter (Signed)
Left VM for Pt's daughter with status update.  

## 2018-01-30 ENCOUNTER — Telehealth: Payer: Self-pay | Admitting: Physician Assistant

## 2018-01-30 NOTE — Telephone Encounter (Signed)
Pt daughter called and stated you have given her a name of four medications for her to discuss with family and see if they want Niasha to take. She said that she would like to go ahead with the Namenda, and the slight increase in Zoloft. She said she would only like the Valium if it was a very small dose and you thought it was necessary for her to have it. She stated she could not remember the 4th medication you had named. Thanks

## 2018-01-31 ENCOUNTER — Other Ambulatory Visit: Payer: Self-pay | Admitting: Physician Assistant

## 2018-01-31 MED ORDER — SERTRALINE HCL 50 MG PO TABS: 50.0000 mg | ORAL_TABLET | Freq: Every day | ORAL | 1 refills | Status: DC

## 2018-01-31 MED ORDER — DIAZEPAM 5 MG PO TABS: 5.0000 mg | ORAL_TABLET | Freq: Every day | ORAL | 1 refills | Status: AC

## 2018-01-31 MED ORDER — MEMANTINE HCL 5 MG PO TABS: 5.0000 mg | ORAL_TABLET | Freq: Every day | ORAL | 1 refills | Status: DC

## 2018-01-31 NOTE — Telephone Encounter (Signed)
I sent over zoloft increase, namenda, and valium(just as needed for irritation/agitation) in the evenings. Follow up in 1 month.

## 2018-01-31 NOTE — Telephone Encounter (Signed)
See telephone note from 01-30-18, daughter called and this was discussed in more detail. Will close out this encounter since note from daughters call yesterday has been sent to [facility]

## 2018-02-01 NOTE — Telephone Encounter (Signed)
Pt's daughter advised. Verbalized understanding.  

## 2018-02-03 ENCOUNTER — Encounter: Payer: Self-pay | Admitting: Physician Assistant

## 2018-02-03 ENCOUNTER — Ambulatory Visit (INDEPENDENT_AMBULATORY_CARE_PROVIDER_SITE_OTHER): Payer: Medicare Other | Admitting: Physician Assistant

## 2018-02-03 VITALS — BP 109/49 | HR 56 | Ht 65.0 in | Wt 150.0 lb

## 2018-02-03 DIAGNOSIS — F05 Delirium due to known physiological condition: Secondary | ICD-10-CM

## 2018-02-03 DIAGNOSIS — M25512 Pain in left shoulder: Secondary | ICD-10-CM

## 2018-02-03 DIAGNOSIS — R413 Other amnesia: Secondary | ICD-10-CM

## 2018-02-03 DIAGNOSIS — G3184 Mild cognitive impairment, so stated: Secondary | ICD-10-CM

## 2018-02-03 DIAGNOSIS — R410 Disorientation, unspecified: Secondary | ICD-10-CM | POA: Diagnosis not present

## 2018-02-03 MED ORDER — DICLOFENAC SODIUM 1 % TD GEL: 4.0000 g | Freq: Four times a day (QID) | TRANSDERMAL | 2 refills | Status: AC

## 2018-02-03 NOTE — Progress Notes (Signed)
Subjective:    Patient ID: Madison Rollins, female    DOB: 1927-12-15, 84 y.o.   MRN: [id number]  HPI  Pt is a 84 yo female who presents to the clinic with daughter and grandson. Her confusion and cognition continue to decline. She has good and bad days. She tends to worsen at night. Recently increased zoloft for mood and added namenda 3 days ago. Family is needed help for times where they cannot be there. She is living btw daughter and grandsons house. They do not feel it is safe for her to be alone. She will call people/credit card companies/and try to cook(she has left stove on before).   She is having some left shoulder pain for last 2 months. No fall or trauma noted. She does sleep on that side. Not taking oral NSAIDs due to CKD. Not taking anything.   .. Active Ambulatory Problems    Diagnosis Date Noted  . Acute right-sided low back pain without sciatica 11/16/2016  . Right hip pain 11/16/2016  . Fall 11/16/2016  . COPD (chronic obstructive pulmonary disease) with chronic bronchitis ([place]) 12/17/2016  . Memory changes 12/17/2016  . Mild cognitive impairment 12/18/2016  . Right inguinal hernia 01/14/2017  . History of UTI 01/14/2017  . Right lower quadrant abdominal pain 01/14/2017  . Confusion 04/04/2017  . Systolic ejection murmur 97/98/9211  . Hypothyroidism 07/06/2017  . History of recurrent UTIs 07/06/2017  . Sundowning 07/08/2017  . Aortic regurgitation 08/03/2017  . Irritable 10/10/2017  . Palpitations 11/09/2017  . Candidal intertrigo 12/27/2017  . Lower leg edema 12/27/2017   Resolved Ambulatory Problems    Diagnosis Date Noted  . Pleural effusion on left 12/01/2016  . Right flank pain 01/14/2017   Past Medical History:  Diagnosis Date  . Cancer ([place])   . Frequency of urination   . Hyperlipidemia   . Hypertension   . Thyroid disease       Review of Systems  All other systems reviewed and are negative.      Objective:   Physical Exam  Constitutional:  She appears well-developed and well-nourished.  HENT:  Head: Normocephalic and atraumatic.  Cardiovascular: Normal rate and regular rhythm.  Murmur heard. Pulmonary/Chest: Effort normal.  Musculoskeletal:  Passive ROM of left shoulder good.  Positive hawkins and pain with external ROM.  Tenderness over acromion and anterior shoulder to palpation.  Strength 4/5 right upper extremity.  Negative yergason/speeds.   Neurological: She is alert.  Psychiatric: She has a normal mood and affect. Her behavior is normal.  She didn't know where she was until she saw the nurse today.           Assessment & Plan:  Marland KitchenMarland KitchenPearl was seen today for shoulder pain.  Diagnoses and all orders for this visit:  Acute pain of left shoulder -     diclofenac sodium (VOLTAREN) 1 % GEL; Apply 4 g topically 4 (four) times daily. To affected joint.  Sundowning -     Ambulatory referral to Home Health  Memory changes -     Ambulatory referral to [place]  Mild cognitive impairment -     Ambulatory referral to [place]  Confusion -     Ambulatory referral to [place]  likely shoulder bursitis and some arthritis. Declined xray today. HO with exercises given. Offered injection declined today. Will try topical NSAIDs. Follow up as needed or with sports medicine. Sleep on other side. Continue with tylenol as needed for pain.  Home health referral to evaluate for some extra help. Pt not safe to leave alone.

## 2018-02-03 NOTE — Patient Instructions (Signed)
Try voltaren gel up to 4 times a day.  Tylenol 1000mg  up to 4 times a day.

## 2018-02-06 ENCOUNTER — Encounter: Payer: Self-pay | Admitting: Physician Assistant

## 2018-02-08 ENCOUNTER — Other Ambulatory Visit: Payer: Self-pay | Admitting: Physician Assistant

## 2018-02-08 DIAGNOSIS — R410 Disorientation, unspecified: Secondary | ICD-10-CM

## 2018-02-08 DIAGNOSIS — G3184 Mild cognitive impairment, so stated: Secondary | ICD-10-CM

## 2018-02-08 DIAGNOSIS — F05 Delirium due to known physiological condition: Secondary | ICD-10-CM

## 2018-02-08 DIAGNOSIS — R413 Other amnesia: Secondary | ICD-10-CM

## 2018-02-08 NOTE — Progress Notes (Signed)
Home health order placed under supervising Physician name per medicare guidelines.

## 2018-02-14 ENCOUNTER — Telehealth: Payer: Self-pay | Admitting: Physician Assistant

## 2018-02-14 NOTE — Telephone Encounter (Signed)
Oliver Hum from [facility] came by and stated that Ms. Turcott's daughter declined home health services at this time because all they are wanting in a home aid to come in and stay with mom. I am going to close the referral - Jenny Reichmann

## 2018-02-14 NOTE — Telephone Encounter (Signed)
I think that is why I made referral to see if insurance would pay for aid. Is this a referral we can make?

## 2018-02-16 NOTE — Telephone Encounter (Signed)
Insurance does not pay for home aids they are all private pay.

## 2018-03-15 ENCOUNTER — Ambulatory Visit (INDEPENDENT_AMBULATORY_CARE_PROVIDER_SITE_OTHER): Payer: Medicare Other | Admitting: Physician Assistant

## 2018-03-15 ENCOUNTER — Encounter: Payer: Self-pay | Admitting: Physician Assistant

## 2018-03-15 VITALS — BP 142/84 | HR 50 | Ht 65.0 in | Wt 149.0 lb

## 2018-03-15 DIAGNOSIS — R197 Diarrhea, unspecified: Secondary | ICD-10-CM | POA: Diagnosis not present

## 2018-03-15 DIAGNOSIS — F05 Delirium due to known physiological condition: Secondary | ICD-10-CM

## 2018-03-15 DIAGNOSIS — F329 Major depressive disorder, single episode, unspecified: Secondary | ICD-10-CM

## 2018-03-15 DIAGNOSIS — E039 Hypothyroidism, unspecified: Secondary | ICD-10-CM | POA: Diagnosis not present

## 2018-03-15 DIAGNOSIS — R4589 Other symptoms and signs involving emotional state: Secondary | ICD-10-CM

## 2018-03-15 MED ORDER — SERTRALINE HCL 50 MG PO TABS: ORAL_TABLET | ORAL | 2 refills | Status: DC

## 2018-03-15 MED ORDER — DIPHENOXYLATE-ATROPINE 2.5-0.025 MG PO TABS: 1.0000 | ORAL_TABLET | Freq: Four times a day (QID) | ORAL | 0 refills | Status: AC | PRN

## 2018-03-15 NOTE — Patient Instructions (Signed)
Decrease aricept to 5mg .  Lomotil as needed.  zoloft increased to 1 and 1/2 tablets.

## 2018-03-17 ENCOUNTER — Encounter: Payer: Self-pay | Admitting: Physician Assistant

## 2018-03-17 DIAGNOSIS — R4589 Other symptoms and signs involving emotional state: Secondary | ICD-10-CM | POA: Insufficient documentation

## 2018-03-17 DIAGNOSIS — R197 Diarrhea, unspecified: Secondary | ICD-10-CM | POA: Insufficient documentation

## 2018-03-17 DIAGNOSIS — F329 Major depressive disorder, single episode, unspecified: Secondary | ICD-10-CM

## 2018-03-17 NOTE — Progress Notes (Signed)
Subjective:    Patient ID: Madison Rollins, female    DOB: Aug 11, 1928, 84 y.o.   MRN: [id number]  HPI Pt is a 84 yo female with dementia/sundowning, COPD, hypothyroidism, AR who presents to the clinic with grandson and his wife who are power of attorney to discuss ongoing diarrhea.   Pt's family reports loose stools occurring 3-4 times a day with urgnency and frequent accidents for the last 3 months. No abdominal pain. No melena or hematochezia. She was recently placed on zoloft, aricept, and namenda. No fever, chills, nausea, urinary frequency or urgency. She has not tried anything to make better.   They would like to sign DNR orders today.   .. Active Ambulatory Problems    Diagnosis Date Noted  . Acute right-sided low back pain without sciatica 11/16/2016  . Right hip pain 11/16/2016  . Fall 11/16/2016  . COPD (chronic obstructive pulmonary disease) with chronic bronchitis ([place]) 12/17/2016  . Memory changes 12/17/2016  . Mild cognitive impairment 12/18/2016  . Right inguinal hernia 01/14/2017  . History of UTI 01/14/2017  . Right lower quadrant abdominal pain 01/14/2017  . Confusion 04/04/2017  . Systolic ejection murmur 63/10/6008  . Hypothyroidism 07/06/2017  . History of recurrent UTIs 07/06/2017  . Sundowning 07/08/2017  . Aortic regurgitation 08/03/2017  . Irritable 10/10/2017  . Palpitations 11/09/2017  . Candidal intertrigo 12/27/2017  . Lower leg edema 12/27/2017  . Depressed mood 03/17/2018  . Diarrhea 03/17/2018   Resolved Ambulatory Problems    Diagnosis Date Noted  . Pleural effusion on left 12/01/2016  . Right flank pain 01/14/2017   Past Medical History:  Diagnosis Date  . Cancer ([place])   . Frequency of urination   . Hyperlipidemia   . Hypertension   . Thyroid disease       Review of Systems  All other systems reviewed and are negative.      Objective:   Physical Exam  Constitutional: She is oriented to person, place, and time. She appears  well-developed and well-nourished.  HENT:  Head: Normocephalic and atraumatic.  Cardiovascular: Normal rate and regular rhythm.  Pulmonary/Chest: Effort normal and breath sounds normal.  No CVA tenderness.   Abdominal: Soft. Bowel sounds are normal. She exhibits no distension and no mass. There is no tenderness. There is no rebound and no guarding. No hernia.  Neurological: She is alert and oriented to person, place, and time.  Psychiatric: She has a normal mood and affect. Her behavior is normal.          Assessment & Plan:  Marland KitchenMarland KitchenPearl was seen today for diarrhea.  Diagnoses and all orders for this visit:  Diarrhea, unspecified type -     diphenoxylate-atropine (LOMOTIL) 2.5-0.025 MG tablet; Take 1 tablet by mouth 4 (four) times daily as needed for diarrhea or loose stools.  Sundowning -     sertraline (ZOLOFT) 50 MG tablet; Take 1 and 1/2 tablet daily for mood.  Depressed mood -     sertraline (ZOLOFT) 50 MG tablet; Take 1 and 1/2 tablet daily for mood.   I think timeline suggest diarrhea could be medication induced. Lets cut aricept in half and see if any improvement. Continue to update via mychart. Lomotil as needed. No signs of diarrhea being infectious. Watch diet triggers.   Increased zoloft. Consider scheduling the day to get her out of the house more. Consider senior citizens outings. Certainly being in the house all day could promote depressed mood.   DNR signed today. Scanned  into EMR. Given patient 2 copies.

## 2018-03-23 ENCOUNTER — Other Ambulatory Visit: Payer: Self-pay | Admitting: Physician Assistant

## 2018-03-23 NOTE — Telephone Encounter (Signed)
Pt is on Aricept per her last appointment. Would she be taking this Namenda as well? Please advise

## 2018-03-24 MED ORDER — SYNTHROID 100 MCG PO TABS: ORAL_TABLET | ORAL | 1 refills | Status: DC

## 2018-03-24 NOTE — Addendum Note (Signed)
Addended by: Dessie Coma on: 03/24/2018 08:27 AM   Modules accepted: Orders

## 2018-04-25 ENCOUNTER — Encounter: Payer: Self-pay | Admitting: *Deleted

## 2018-04-25 ENCOUNTER — Emergency Department
Admission: EM | Admit: 2018-04-25 | Discharge: 2018-04-25 | Disposition: A | Discharge status: Home / Self Care | Payer: Medicare Other | Source: Home / Self Care | Attending: Family Medicine | Admitting: Family Medicine

## 2018-04-25 ENCOUNTER — Emergency Department (INDEPENDENT_AMBULATORY_CARE_PROVIDER_SITE_OTHER): Payer: Medicare Other

## 2018-04-25 ENCOUNTER — Other Ambulatory Visit: Payer: Self-pay

## 2018-04-25 DIAGNOSIS — M545 Low back pain, unspecified: Secondary | ICD-10-CM

## 2018-04-25 DIAGNOSIS — M25552 Pain in left hip: Secondary | ICD-10-CM

## 2018-04-25 DIAGNOSIS — S20222A Contusion of left back wall of thorax, initial encounter: Secondary | ICD-10-CM

## 2018-04-25 HISTORY — DX: Unspecified dementia, unspecified severity, without behavioral disturbance, psychotic disturbance, mood disturbance, and anxiety: F03.90

## 2018-04-25 NOTE — ED Provider Notes (Addendum)
[facility]    CSN: [id number] Arrival date & time: 04/25/18  1130     History   Chief Complaint Chief Complaint  Patient presents with  . Back Pain    HPI Harleyquinn Gasser is a 84 y.o. female.   HPI  Oluwadarasimi Favor is a 84 y.o. female presenting to UC with c/o lower back pain from middle to the left side that started 1 week ago after falling on her buttock. She was seen in the emergency department 3 days after the fall and had Lumbar plain films. They showed a chronic T12 compression fracture but no acute findings. This morning pt c/o worsening pain and daughter noticed bruising to the Left lower side of her back.  She has given her ibuprofen with mild relief.  Daughter states pt's PCP had prescribed a topical pain medication for her shoulder a few weeks ago but they have not needed to use it because the shoulder pain resolved. They have not tried that medication on her back yet.   Pulse is low, 46 bpm in triage. She is on Metoprolol for palpitations. Denies palpitations, chest pain, dizziness or SOB at this time.   Past Medical History:  Diagnosis Date  . Cancer ([place])   . Dementia   . Frequency of urination   . Hyperlipidemia   . Hypertension   . Thyroid disease     Patient Active Problem List   Diagnosis Date Noted  . Depressed mood 03/17/2018  . Diarrhea 03/17/2018  . Candidal intertrigo 12/27/2017  . Lower leg edema 12/27/2017  . Palpitations 11/09/2017  . Irritable 10/10/2017  . Aortic regurgitation 08/03/2017  . Sundowning 07/08/2017  . Systolic ejection murmur 24/26/8341  . Hypothyroidism 07/06/2017  . History of recurrent UTIs 07/06/2017  . Confusion 04/04/2017  . Right inguinal hernia 01/14/2017  . History of UTI 01/14/2017  . Right lower quadrant abdominal pain 01/14/2017  . Mild cognitive impairment 12/18/2016  . COPD (chronic obstructive pulmonary disease) with chronic bronchitis ([place]) 12/17/2016  . Memory changes 12/17/2016  . Acute right-sided  low back pain without sciatica 11/16/2016  . Right hip pain 11/16/2016  . Fall 11/16/2016    Past Surgical History:  Procedure Laterality Date  . BACK SURGERY    . bladder tacked  2010    OB History   None      Home Medications    Prior to Admission medications   Medication Sig Start Date End Date Taking? Authorizing Provider  albuterol (PROVENTIL HFA;VENTOLIN HFA) 108 (90 Base) MCG/ACT inhaler Inhale 2 puffs into the lungs every 6 (six) hours as needed for wheezing or shortness of breath. 12/17/16   Breeback, Royetta Car, PA-C  AMBULATORY NON FORMULARY MEDICATION Insurance account manager. 10/07/17   Breeback, Jade L, PA-C  clotrimazole-betamethasone (LOTRISONE) cream Apply 1 application topically 2 (two) times daily. 12/27/17   Breeback, Jade L, PA-C  diazepam (VALIUM) 5 MG tablet Take 1 tablet (5 mg total) by mouth at bedtime. Or in the evening for irritation. 01/31/18   Breeback, Jade L, PA-C  diclofenac sodium (VOLTAREN) 1 % GEL Apply 4 g topically 4 (four) times daily. To affected joint. 02/03/18   Breeback, Royetta Car, PA-C  diphenoxylate-atropine (LOMOTIL) 2.5-0.025 MG tablet Take 1 tablet by mouth 4 (four) times daily as needed for diarrhea or loose stools. 03/15/18   Breeback, Jade L, PA-C  donepezil (ARICEPT) 10 MG tablet Take 1 tablet (10 mg total) by mouth at bedtime. 11/21/17   Donella Stade,  PA-C  memantine (NAMENDA) 5 MG tablet TAKE 1 TABLET(5 MG) BY MOUTH DAILY 03/23/18   Hali Marry, MD  metoprolol tartrate (LOPRESSOR) 25 MG tablet Take 1 tablet (25 mg total) by mouth daily. 11/21/17   Breeback, Jade L, PA-C  montelukast (SINGULAIR) 10 MG tablet Take 1 tablet (10 mg total) by mouth at bedtime. 11/09/17   Breeback, Jade L, PA-C  sertraline (ZOLOFT) 50 MG tablet Take 1 tablet (50 mg total) by mouth daily. 01/31/18   Breeback, Jade L, PA-C  sertraline (ZOLOFT) 50 MG tablet Take 1 and 1/2 tablet daily for mood. 03/15/18   Breeback, Jade L, PA-C  SYNTHROID 100 MCG tablet  TAKE 1 TABLET BY MOUTH EVERY DAY BEFORE BREAKFAST 03/24/18   Breeback, Jade L, PA-C  VESICARE 5 MG tablet Take 1 tablet (5 mg total) by mouth daily. 11/21/17   Breeback, Royetta Car, PA-C  Vitamin D, Ergocalciferol, (DRISDOL) 50000 units CAPS capsule Take 1 capsule (50,000 Units total) by mouth every 7 (seven) days. 07/29/17   Donella Stade, PA-C    Family History Family History  Problem Relation Age of Onset  . Diabetes Brother   . Hyperlipidemia Mother   . Hypertension Mother   . Cancer Father        lung  . Cancer Sister        breast    Social History Social History   Tobacco Use  . Smoking status: Never Smoker  . Smokeless tobacco: Never Used  Substance Use Topics  . Alcohol use: No  . Drug use: No     Allergies   Hydrocodone-acetaminophen   Review of Systems Review of Systems  Musculoskeletal: Positive for arthralgias, back pain, gait problem and myalgias. Negative for joint swelling.  Skin: Positive for color change. Negative for wound.  Neurological: Negative for weakness and numbness.     Physical Exam Triage Vital Signs ED Triage Vitals  Enc Vitals Group     BP 04/25/18 1209 (!) 143/64     Pulse Rate 04/25/18 1209 (!) 46     Resp 04/25/18 1209 16     Temp 04/25/18 1209 98 F (36.7 C)     Temp Source 04/25/18 1209 Oral     SpO2 04/25/18 1209 94 %     Weight 04/25/18 1210 152 lb (68.9 kg)     Height 04/25/18 1210 5\' 5"  (1.651 m)     Head Circumference --      Peak Flow --      Pain Score 04/25/18 1210 5     Pain Loc --      Pain Edu? --      Excl. in [place]? --    No data found.  Updated Vital Signs BP (!) 143/64 (BP Location: Right Arm)   Pulse (!) 46   Temp 98 F (36.7 C) (Oral)   Resp 16   Ht 5\' 5"  (1.651 m)   Wt 152 lb (68.9 kg)   SpO2 94%   BMI 25.29 kg/m   Visual Acuity Right Eye Distance:   Left Eye Distance:   Bilateral Distance:    Right Eye Near:   Left Eye Near:    Bilateral Near:     Physical Exam  Constitutional: She is  oriented to person, place, and time. She appears well-developed and well-nourished. No distress.  HENT:  Head: Normocephalic and atraumatic.  Eyes: EOM are normal.  Neck: Normal range of motion.  Cardiovascular: Bradycardia present.  Pulmonary/Chest: Effort normal.  Musculoskeletal: She exhibits tenderness. She exhibits no edema.  Mild tenderness to lower lumbar spine and Left side lumbar muscles into Left hip. No crepitus.   Neurological: She is alert and oriented to person, place, and time.  Skin: Skin is warm and dry. She is not diaphoretic.     Skin in tact. Faint linear distributed ecchymosis to Left lower back.  Psychiatric: She has a normal mood and affect. Her behavior is normal.  Nursing note and vitals reviewed.    UC Treatments / Results  Labs (all labs ordered are listed, but only abnormal results are displayed) Labs Reviewed - No data to display  EKG None  Radiology Dg Hip Unilat W Or Wo Pelvis 2-3 Views Left  Result Date: 04/25/2018 CLINICAL DATA:  84 year old female with left hip and low back pain after a fall 1 week ago. EXAM: DG HIP (WITH OR WITHOUT PELVIS) 2-3V LEFT COMPARISON:  Pelvis and left hip series 07/18/2014 FINDINGS: The femoral heads are normally located. Hip joint spaces appear symmetric and normal for age. Osteopenia. Grossly intact proximal right femur. No pelvis fracture identified. The SI joints appear stable. Proximal left femur appears intact. Negative lower abdominal and pelvic visceral contours. IMPRESSION: No acute fracture or dislocation identified about the left hip or pelvis. Electronically Signed   By: Genevie Ann M.D.   On: 04/25/2018 12:50    Procedures Procedures (including critical care time)  Medications Ordered in UC Medications - No data to display  Initial Impression / Assessment and Plan / UC Course  I have reviewed the triage vital signs and the nursing notes.  Pertinent labs & imaging results that were available during my care  of the patient were reviewed by me and considered in my medical decision making (see chart for details).     Discussed imaging with Dr. Georgina Snell who also briefly examined pt. Recommend f/u in his office in 2 days for further evaluation and treatment  In meantime, recommend trying previously prescribed topical pain medication on her back and OTC Aspercreme Pt and daughter agreeable with plan.   Final Clinical Impressions(s) / UC Diagnoses   Final diagnoses:  Acute left-sided low back pain without sciatica  Contusion, back, left, initial encounter     Discharge Instructions      Please try the topical pain relief cream/gel that Jade initially prescribed for shoulder pain on your back and hip as well as over the counter Aspercreme until you can follow up with Dr. Georgina Snell, Sports Medicine, on Thursday 04/27/18 at 11:30AM     ED Prescriptions    None     Controlled Substance Prescriptions [place] Controlled Substance Registry consulted? Not Applicable   Tyrell Antonio 04/25/18 1529    Noe Gens, PA-C 04/25/18 1600

## 2018-04-25 NOTE — ED Triage Notes (Signed)
Pt c/o LBP in the middle of her back x 1 wk post fall. She fell on her buttocks. She applied ice and took IBF, went to the ED 3 days later with negative xrays.

## 2018-04-25 NOTE — Discharge Instructions (Signed)
°  Please try the topical pain relief cream/gel that Jade initially prescribed for shoulder pain on your back and hip as well as over the counter Aspercreme until you can follow up with Dr. Georgina Snell, Sports Medicine, on Thursday 04/27/18 at 11:30AM

## 2018-04-27 ENCOUNTER — Ambulatory Visit (INDEPENDENT_AMBULATORY_CARE_PROVIDER_SITE_OTHER): Payer: Medicare Other | Admitting: Family Medicine

## 2018-04-27 ENCOUNTER — Ambulatory Visit (INDEPENDENT_AMBULATORY_CARE_PROVIDER_SITE_OTHER): Payer: Medicare Other

## 2018-04-27 ENCOUNTER — Encounter: Payer: Self-pay | Admitting: Family Medicine

## 2018-04-27 VITALS — BP 162/76 | HR 46 | Wt 148.0 lb

## 2018-04-27 DIAGNOSIS — R0789 Other chest pain: Secondary | ICD-10-CM | POA: Diagnosis not present

## 2018-04-27 DIAGNOSIS — W19XXXA Unspecified fall, initial encounter: Secondary | ICD-10-CM | POA: Diagnosis not present

## 2018-04-27 DIAGNOSIS — S39012A Strain of muscle, fascia and tendon of lower back, initial encounter: Secondary | ICD-10-CM | POA: Diagnosis not present

## 2018-04-27 DIAGNOSIS — R109 Unspecified abdominal pain: Secondary | ICD-10-CM

## 2018-04-27 DIAGNOSIS — I10 Essential (primary) hypertension: Secondary | ICD-10-CM | POA: Insufficient documentation

## 2018-04-27 DIAGNOSIS — R001 Bradycardia, unspecified: Secondary | ICD-10-CM | POA: Diagnosis not present

## 2018-04-27 MED ORDER — AMLODIPINE BESYLATE 5 MG PO TABS: 5.0000 mg | ORAL_TABLET | Freq: Every day | ORAL | 1 refills | Status: DC

## 2018-04-27 MED ORDER — AMBULATORY NON FORMULARY MEDICATION: 0 refills | Status: AC

## 2018-04-27 NOTE — Progress Notes (Signed)
Madison Rollins is a 84 y.o. female who presents to [facility] today for back pain and fall.  Madison Rollins suffered a fall on July 17.  She reportedly slowly slid to the ground after colliding with the wall.  She denies hitting her head.  She was seen in the emergency department on the 19th or x-rays of her Lumbar spine did not show any acute fractures.  She continued to experience pain and was seen in urgent care on July 23.  A hip x-ray did not show any fractures then as well.  She continues to have left low back pain worse with activity better with rest.  She denies any radiating pain.  She has been using topical diclofenac gel, Aspercreme and Tylenol.  Of note both in urgent care in the emergency room she was found to have bradycardia with a heart rate in the 40s.  Madison Rollins denies lightheadedness or dizziness and denies palpitations or chest pain prior to her fall but is not sure.  She takes metoprolol but has not had a dose change recently.  She has a relevant medical history for moderate dementia with sundowning.  Her family would like to avoid medications that are likely to exacerbate this if possible.  She lives with her family.   ROS as above: No headache, visual changes, nausea, vomiting, diarrhea, constipation, dizziness, abdominal pain, skin rash, fevers, chills, night sweats, weight loss, swollen lymph nodes, , joint swelling, muscle aches, chest pain, shortness of breath, mood changes, visual or auditory hallucinations.   Exam:  BP (!) 162/76   Pulse (!) 46   Wt 148 lb (67.1 kg)   BMI 24.63 kg/m  Gen: Well NAD HEENT: EOMI,  MMM Lungs: Normal work of breathing. CTABL Heart: Bradycardia but regular no MRG Abd: NABS, Soft. Nondistended, Nontender Exts: Brisk capillary refill, warm and well perfused.  Spine: T-spine and L-spine are nontender to midline. L-spine tender to  palpation lumbar paraspinal muscle group. Low back motion restricted due to pain. Pain with rotation and extension and flexion. Lower extremity strength is intact. Patient is able to stand from a seated position and ambulate.  Twelve-lead EKG: Sinus bradycardia rate 41 bpm.  No ST segment elevation or depression.  Normal QTC.  Lab and Radiology Results No results found for this or any previous visit (from the past 72 hour(s)). Dg Ribs Unilateral W/chest Left  Result Date: 04/27/2018 CLINICAL DATA:  Fall several days ago with persistent left-sided chest pain, initial encounter EXAM: LEFT RIBS AND CHEST - 3+ VIEW COMPARISON:  12/01/2016 FINDINGS: Cardiac shadows within normal limits. The lungs are well aerated bilaterally. No focal infiltrate or sizable effusion is seen. No pneumothorax is noted. No acute rib fractures are noted. IMPRESSION: No acute abnormality noted. Electronically Signed   By: Inez Catalina M.D.   On: 04/27/2018 14:47   Dg Hip Unilat W Or Wo Pelvis 2-3 Views Left  Result Date: 04/25/2018 CLINICAL DATA:  84 year old female with left hip and low back pain after a fall 1 week ago. EXAM: DG HIP (WITH OR WITHOUT PELVIS) 2-3V LEFT COMPARISON:  Pelvis and left hip series 07/18/2014 FINDINGS: The femoral heads are normally located. Hip joint spaces appear symmetric and normal for age. Osteopenia. Grossly intact proximal right femur. No pelvis fracture identified. The SI joints appear stable. Proximal left femur appears intact. Negative lower abdominal and pelvic visceral contours. IMPRESSION: No acute fracture or dislocation identified about the left  hip or pelvis. Electronically Signed   By: Genevie Ann M.D.   On: 04/25/2018 12:50   I personally (independently) visualized and performed the interpretation of the images attached in this note.  Acute Interface, Incoming Rad Results - 04/21/2018  8:17 PM EDT TECHNIQUE: XR SPINE LUMBAR 2-3 VIEWS  INDICATION: Fall  COMPARISON:  None  FINDINGS: There are five lumbar type vertebral bodies. There is a wedge-shaped compression deformity with approximately 50% loss of vertebral body height at T12. This most likely corresponds to fracture identified on CT dated 11/04/2016. Acute fracture is considered  unlikely. Remainder of the vertebral bodies are intact. There is no malalignment.   There is degenerative narrowing of the disc spaces with bilateral facet hypertrophy at L4-5 and L5-S1.   There is generalized osteopenia.   The bony pelvis is intact.  Soft tissues are unremarkable.   IMPRESSION: Chronic compression fracture of T12. No acute fracture or malalignment is identified.  Electronically Signed by: Virl Cagey   Assessment and Plan: 84 y.o. female with  Fall with back and flank pain.  Likely myofascial strain and contusion.  No fractures identified.  Plan for referral to physical therapy, and a walker.  Continue over-the-counter management as well as diclofenac gel lidocaine patches heating pad Tylenol.  Will use a walker to aid mobility.  We will continue to try to avoid narcotics due to the concern for delirium and increased risk of falls.  Expect she will continue to improve shortly.  Patient also has bradycardia.  This is quite significant.  She does currently take metoprolol daily.  I think this is the most likely causes her EKG does show bradycardia but otherwise is not abnormal.  Plan to discontinue metoprolol and start amlodipine for blood pressure management.  Follow-up early next week.  Recheck sooner if needed.   Orders Placed This Encounter  Procedures  . DG Ribs Unilateral W/Chest Left    Order Specific Question:   Reason for exam:    Answer:   Injury, rib pain. assess for fracture, pneumothorax.    Order Specific Question:   Preferred imaging location?    Answer:   [facility]  . Ambulatory referral to Physical Therapy    Referral Priority:   Routine    Referral Type:    Physical Medicine    Referral Reason:   Specialty Services Required    Requested Specialty:   Physical Therapy  . EKG 12-Lead   Meds ordered this encounter  Medications  . AMBULATORY NON FORMULARY MEDICATION    Sig: Walker or rolling walker use as needed for falls and gait.  Disp 1 S36.12A W19.xxxa    Dispense:  1 application    Refill:  0  . amLODipine (NORVASC) 5 MG tablet    Sig: Take 1 tablet (5 mg total) by mouth daily.    Dispense:  30 tablet    Refill:  1     Historical information moved to improve visibility of documentation.  Past Medical History:  Diagnosis Date  . Cancer ([place])   . Dementia   . Frequency of urination   . Hyperlipidemia   . Hypertension   . Thyroid disease    Past Surgical History:  Procedure Laterality Date  . BACK SURGERY    . bladder tacked  2010   Social History   Tobacco Use  . Smoking status: Never Smoker  . Smokeless tobacco: Never Used  Substance Use Topics  . Alcohol use: No   family history  includes Cancer in her father and sister; Diabetes in her brother; Hyperlipidemia in her mother; Hypertension in her mother.  Medications: Current Outpatient Medications  Medication Sig Dispense Refill  . albuterol (PROVENTIL HFA;VENTOLIN HFA) 108 (90 Base) MCG/ACT inhaler Inhale 2 puffs into the lungs every 6 (six) hours as needed for wheezing or shortness of breath. 1 Inhaler 2  . AMBULATORY NON FORMULARY MEDICATION Walker or rolling walker use as needed for falls and gait.  Disp 1 S36.87F I43.PIRJ 1 application 0  . clotrimazole-betamethasone (LOTRISONE) cream Apply 1 application topically 2 (two) times daily. 45 g 1  . diazepam (VALIUM) 5 MG tablet Take 1 tablet (5 mg total) by mouth at bedtime. Or in the evening for irritation. 30 tablet 1  . diclofenac sodium (VOLTAREN) 1 % GEL Apply 4 g topically 4 (four) times daily. To affected joint. 100 g 2  . diphenoxylate-atropine (LOMOTIL) 2.5-0.025 MG tablet Take 1 tablet by mouth 4 (four)  times daily as needed for diarrhea or loose stools. 30 tablet 0  . donepezil (ARICEPT) 10 MG tablet Take 1 tablet (10 mg total) by mouth at bedtime. 90 tablet 1  . memantine (NAMENDA) 5 MG tablet TAKE 1 TABLET(5 MG) BY MOUTH DAILY 30 tablet 0  . montelukast (SINGULAIR) 10 MG tablet Take 1 tablet (10 mg total) by mouth at bedtime. 90 tablet 3  . sertraline (ZOLOFT) 50 MG tablet Take 1 tablet (50 mg total) by mouth daily. 30 tablet 1  . sertraline (ZOLOFT) 50 MG tablet Take 1 and 1/2 tablet daily for mood. 60 tablet 2  . SYNTHROID 100 MCG tablet TAKE 1 TABLET BY MOUTH EVERY DAY BEFORE BREAKFAST 90 tablet 1  . VESICARE 5 MG tablet Take 1 tablet (5 mg total) by mouth daily. 90 tablet 1  . Vitamin D, Ergocalciferol, (DRISDOL) 50000 units CAPS capsule Take 1 capsule (50,000 Units total) by mouth every 7 (seven) days. 12 capsule 5  . amLODipine (NORVASC) 5 MG tablet Take 1 tablet (5 mg total) by mouth daily. 30 tablet 1   Current Facility-Administered Medications  Medication Dose Route Frequency Provider Last Rate Last Dose  . albuterol (PROVENTIL) (2.5 MG/3ML) 0.083% nebulizer solution 2.5 mg  2.5 mg Nebulization Once Breeback, Jade L, PA-C       Allergies  Allergen Reactions  . Hydrocodone-Acetaminophen Other (See Comments)    Hallucinations      Discussed warning signs or symptoms. Please see discharge instructions. Patient expresses understanding.

## 2018-04-27 NOTE — Patient Instructions (Addendum)
Thank you for coming in today. Add tylenol to motrin and topicals.  Continue over the counter lidocaine patches.  Use heating pad STOP metoprolol Start amlodipine.   Recheck next week with Madison Rollins or myself.   Get the walker at a medical supply company  Attend PT.    Lumbosacral Strain Lumbosacral strain is an injury that causes pain in the lower back (lumbosacral spine). This injury usually occurs from overstretching the muscles or ligaments along your spine. A strain can affect one or more muscles or cord-like tissues that connect bones to other bones (ligaments). What are the causes? This condition may be caused by:  A hard, direct hit (blow) to the back.  Excessive stretching of the lower back muscles. This may result from: ? A fall. ? Lifting something heavy. ? Repetitive movements such as bending or crouching.  What increases the risk? The following factors may increase your risk of getting this condition:  Participating in sports or activities that involve: ? A sudden twist of the back. ? Pushing or pulling motions.  Being overweight or obese.  Having poor strength and flexibility, especially tight hamstrings or weak muscles in the back or abdomen.  Having too much of a curve in the lower back.  Having a pelvis that is tilted forward.  What are the signs or symptoms? The main symptom of this condition is pain in the lower back, at the site of the strain. Pain may extend (radiate) down one or both legs. How is this diagnosed? This condition is diagnosed based on:  Your symptoms.  Your medical history.  A physical exam. ? Your health care provider may push on certain areas of your back to determine the source of your pain. ? You may be asked to bend forward, backward, and side to side to assess the severity of your pain and your range of motion.  Imaging tests, such as: ? X-rays. ? MRI.  How is this treated? Treatment for this condition may  include:  Putting heat and cold on the affected area.  Medicines to help relieve pain and relax your muscles (muscle relaxants).  NSAIDs to help reduce swelling and discomfort.  When your symptoms improve, it is important to gradually return to your normal routine as soon as possible to reduce pain, avoid stiffness, and avoid loss of muscle strength. Generally, symptoms should improve within 6 weeks of treatment. However, recovery time varies. Follow these instructions at home: Managing pain, stiffness, and swelling   If directed, put ice on the injured area during the first 24 hours after your strain. ? Put ice in a plastic bag. ? Place a towel between your skin and the bag. ? Leave the ice on for 20 minutes, 2-3 times a day.  If directed, put heat on the affected area as often as told by your health care provider. Use the heat source that your health care provider recommends, such as a moist heat pack or a heating pad. ? Place a towel between your skin and the heat source. ? Leave the heat on for 20-30 minutes. ? Remove the heat if your skin turns bright red. This is especially important if you are unable to feel pain, heat, or cold. You may have a greater risk of getting burned. Activity  Rest and return to your normal activities as told by your health care provider. Ask your health care provider what activities are safe for you.  Avoid activities that take a lot of energy for as  long as told by your health care provider. General instructions  Take over-the-counter and prescription medicines only as told by your health care provider.  Donot drive or use heavy machinery while taking prescription pain medicine.  Do not use any products that contain nicotine or tobacco, such as cigarettes and e-cigarettes. If you need help quitting, ask your health care provider.  Keep all follow-up visits as told by your health care provider. This is important. How is this prevented?  Use  correct form when playing sports and lifting heavy objects.  Use good posture when sitting and standing.  Maintain a healthy weight.  Sleep on a mattress with medium firmness to support your back.  Be safe and responsible while being active to avoid falls.  Do at least 150 minutes of moderate-intensity exercise each week, such as brisk walking or water aerobics. Try a form of exercise that takes stress off your back, such as swimming or stationary cycling.  Maintain physical fitness, including: ? Strength. ? Flexibility. ? Cardiovascular fitness. ? Endurance. Contact a health care provider if:  Your back pain does not improve after 6 weeks of treatment.  Your symptoms get worse. Get help right away if:  Your back pain is severe.  You cannot stand or walk.  You have difficulty controlling when you urinate or when you have a bowel movement.  You feel nauseous or you vomit.  Your feet get very cold.  You have numbness, tingling, weakness, or problems using your arms or legs.  You develop any of the following: ? Shortness of breath. ? Dizziness. ? Pain in your legs. ? Weakness in your buttocks or legs. ? Discoloration of the skin on your toes or legs. This information is not intended to replace advice given to you by your health care provider. Make sure you discuss any questions you have with your health care provider. Document Released: 06/30/2005 Document Revised: 04/09/2016 Document Reviewed: 02/22/2016 Elsevier Interactive Patient Education  [employer].

## 2018-05-01 ENCOUNTER — Encounter: Payer: Self-pay | Admitting: Physical Therapy

## 2018-05-01 ENCOUNTER — Ambulatory Visit (INDEPENDENT_AMBULATORY_CARE_PROVIDER_SITE_OTHER): Payer: Medicare Other | Admitting: Physical Therapy

## 2018-05-01 ENCOUNTER — Ambulatory Visit: Payer: Medicare Other | Admitting: Physician Assistant

## 2018-05-01 DIAGNOSIS — M545 Low back pain, unspecified: Secondary | ICD-10-CM

## 2018-05-01 NOTE — Therapy (Signed)
[facility] [place], [place], [address] Phone: [phone]   Fax:  [phone]  Physical Therapy Evaluation  Patient Details  Name: Madison Rollins MRN: [id number] Date of Birth: [birth year] Referring Provider: Donnelly Stager PA-C   Encounter Date: 05/01/2018  PT End of Session - 05/01/18 1242    Visit Number  1    Number of Visits  12    Date for PT Re-Evaluation  06/12/18    PT Start Time  2637    PT Stop Time  1100    PT Time Calculation (min)  45 min    Activity Tolerance  Patient limited by pain    Behavior During Therapy  [facility] for tasks assessed/performed       Past Medical History:  Diagnosis Date  . Cancer ([place])   . Dementia   . Frequency of urination   . Hyperlipidemia   . Hypertension   . Thyroid disease     Past Surgical History:  Procedure Laterality Date  . BACK SURGERY    . bladder tacked  2010    There were no vitals filed for this visit.   Subjective Assessment - 05/01/18 1228    Subjective  Pt relays she fell on 04/19/18 and has had severe back pain since. She had neg x-rays for back and hip for acute fracture but there is evidence of old compresison Fx at T12 on x-ray.     Pertinent History  mod dementia    Limitations  Sitting;Lifting;Standing;Walking;House hold activities    Diagnostic tests  x-rays neg for acute injury of fx    Patient Stated Goals  guit hurting so much    Currently in Pain?  Yes    Pain Score  9     Pain Location  Back    Pain Orientation  Right;Left;Lower    Pain Descriptors / Indicators  Aching;Shooting;Tightness    Pain Type  Acute pain    Pain Radiating Towards  around to her stomach    Pain Onset  1 to 4 weeks ago    Pain Frequency  Constant    Aggravating Factors   any movement or activity    Pain Relieving Factors  nothing         [facility] PT Assessment - 05/01/18 0001      Assessment   Medical Diagnosis  LBP after falling    Referring Provider   Donnelly Stager PA-C    Onset Date/Surgical Date  04/19/18    Next MD Visit  05/02/18    Prior Therapy  none      Precautions   Precautions  Fall      Balance Screen   Has the patient fallen in the past 6 months  Yes    How many times?  1    Has the patient had a decrease in activity level because of a fear of falling?   Yes    Is the patient reluctant to leave their home because of a fear of falling?   No      Prior Function   Level of Independence  Requires assistive device for independence      Cognition   Overall Cognitive Status  History of cognitive impairments - at baseline dementia      Observation/Other Assessments   Focus on Therapeutic Outcomes (FOTO)   90% limited      Sensation   Light Touch  Appears Intact      Posture/Postural  Control   Posture Comments  fwd head, slumped posture, scoliosis      ROM / Strength   AROM / PROM / Strength  AROM;Strength      AROM   Overall AROM   Deficits;Due to pain overall less than 25% lumbar ROM all planes      Strength   Overall Strength  Deficits;Due to pain overall 4-/5 LE MMT due to pain    Overall Strength Comments  poor core strength      Palpation   Palpation comment  TTP in lumbar and lower thoracic area bilat with bruising noted      Special Tests   Other special tests  -- positive slump test and quadrant test       Ambulation/Gait   Ambulation/Gait  -- Pt unsteady with gait, recommended she use RW for now                Objective measurements completed on examination: See above findings.      [facility] Adult PT Treatment/Exercise - 05/01/18 0001      Modalities   Modalities  Cryotherapy;Electrical Stimulation      Cryotherapy   Number Minutes Cryotherapy  15 Minutes    Cryotherapy Location  Lumbar Spine    Type of Cryotherapy  Ice pack      Electrical Stimulation   Electrical Stimulation Location  lumbar    Electrical Stimulation Action  TENS    Electrical Stimulation Parameters  tolerance     Electrical Stimulation Goals  Pain             PT Education - 05/01/18 1242    Education Details  HEP, TENS, POC    Person(s) Educated  Patient;Child(ren)    Methods  Explanation;Demonstration;Verbal cues;Handout    Comprehension  Verbalized understanding;Need further instruction          PT Long Term Goals - 05/01/18 1250      PT LONG TERM GOAL #1   Title  Pt will be I and compliant with HEP. 6 weeks 06/12/18    Status  New      PT LONG TERM GOAL #2   Title  Pt will improve FOTO score to less than 60% limited. 6 weeks 06/12/18    Status  New      PT LONG TERM GOAL #3   Title  Pt will improve Lumbar AROM to [facility]. 6 weeks 06/12/18    Status  New      PT LONG TERM GOAL #4   Title  Pt will be able to ambulate community distances with least restrictive AD. 6 weeks 06/12/18    Status  New      PT LONG TERM GOAL #5   Title  Pt will report less than 2-3/10 with ADL's including bathing, dressing, transfers             Plan - 05/01/18 1243    Clinical Impression Statement  Pt presents with acute LBP following a fall on 04/19/18. She has negative x-rays for acute injury but she is having severe back pain that is aggravated by any activity or movement. She has decreased lumbar ROM, decreaesed core strength and LE strength, increased tenderness in her lumbar P.S bilat, she is unsteady of her feet and is now needing more assistance with dressing and transfers due to pain. She was encouraged to use RW for amublation at this time as she is a falls risk. She will benefit from PT to address her  deficits.     History and Personal Factors relevant to plan of care:  bradycardia, dementia    Clinical Presentation  Evolving    Clinical Decision Making  Moderate    Rehab Potential  Fair    PT Frequency  2x / week    PT Duration  6 weeks    PT Treatment/Interventions  ADLs/Self Care Home Management;Electrical Stimulation;Moist Heat;Therapeutic exercise;Functional mobility training;Balance  training;Neuromuscular re-education;Patient/family education;Manual techniques    PT Next Visit Plan  review HEP, gentle ROM and stretching, gait/balance       Patient will benefit from skilled therapeutic intervention in order to improve the following deficits and impairments:  Decreased activity tolerance, Decreased balance, Decreased mobility, Decreased range of motion, Decreased safety awareness, Decreased strength, Difficulty walking, Hypomobility, Increased muscle spasms, Postural dysfunction, Pain  Visit Diagnosis: Acute bilateral low back pain without sciatica     Problem List Patient Active Problem List   Diagnosis Date Noted  . HTN (hypertension) 04/27/2018  . Depressed mood 03/17/2018  . Diarrhea 03/17/2018  . Candidal intertrigo 12/27/2017  . Lower leg edema 12/27/2017  . Palpitations 11/09/2017  . Irritable 10/10/2017  . Aortic regurgitation 08/03/2017  . Sundowning 07/08/2017  . Systolic ejection murmur 16/07/9603  . Hypothyroidism 07/06/2017  . History of recurrent UTIs 07/06/2017  . Confusion 04/04/2017  . Right inguinal hernia 01/14/2017  . History of UTI 01/14/2017  . Right lower quadrant abdominal pain 01/14/2017  . Mild cognitive impairment 12/18/2016  . COPD (chronic obstructive pulmonary disease) with chronic bronchitis ([place]) 12/17/2016  . Memory changes 12/17/2016  . Acute right-sided low back pain without sciatica 11/16/2016  . Right hip pain 11/16/2016  . Fall 11/16/2016    Debbe Odea, PT, DPT 05/01/2018, 12:55 PM  [facility] [place], [place], [address] Phone: [phone]   Fax:  [phone]  Name: Madison Rollins MRN: [id number] Date of Birth: 01-23-1928

## 2018-05-01 NOTE — Patient Instructions (Addendum)
Access Code: [facility]  URL: [email or web address]  Date: 05/01/2018  Prepared by: Elsie Ra   Exercises  Seated Hamstring Stretch - 2-3 sets - 30 hold - 2x daily - 6x weekly  Seated Trunk Rotation - Arms Crossed - 10 reps - 1 sets - 5 hold - 2x daily - 6x weekly  Seated Transversus Abdominis Bracing - 10 reps - 1 sets - 10 hold - 2x daily - 6x weekly  Seated March - 10 reps - 1-2 sets - 3 hold - 2x daily - 6x weekly   Pt provided illustrated copy  TENS UNIT: This is helpful for muscle pain and spasm.   Search and Purchase a TENS 7000 2nd edition at www.tenspros.com. It should be less than $30.     TENS unit instructions: Do not shower or bathe with the unit on Turn the unit off before removing electrodes or batteries If the electrodes lose stickiness add a drop of water to the electrodes after they are disconnected from the unit and place on plastic sheet. If you continued to have difficulty, call the TENS unit company to purchase more electrodes. Do not apply lotion on the skin area prior to use. Make sure the skin is clean and dry as this will help prolong the life of the electrodes. After use, always check skin for unusual red areas, rash or other skin difficulties. If there are any skin problems, does not apply electrodes to the same area. Never remove the electrodes from the unit by pulling the wires. Do not use the TENS unit or electrodes other than as directed. Do not change electrode placement without consultating your therapist or physician. Keep 2 fingers with between each electrode. Wear time ratio is 2:1, on to off times.    For example on for 30 minutes off for 15 minutes and then on for 30 minutes off for 15 minutes

## 2018-05-02 ENCOUNTER — Encounter: Payer: Self-pay | Admitting: Physician Assistant

## 2018-05-02 ENCOUNTER — Other Ambulatory Visit: Payer: Self-pay | Admitting: Physician Assistant

## 2018-05-02 ENCOUNTER — Ambulatory Visit (INDEPENDENT_AMBULATORY_CARE_PROVIDER_SITE_OTHER): Payer: Medicare Other | Admitting: Physician Assistant

## 2018-05-02 VITALS — BP 115/75 | HR 60 | Ht 65.0 in

## 2018-05-02 DIAGNOSIS — R001 Bradycardia, unspecified: Secondary | ICD-10-CM | POA: Diagnosis not present

## 2018-05-02 DIAGNOSIS — K5900 Constipation, unspecified: Secondary | ICD-10-CM

## 2018-05-02 DIAGNOSIS — N3001 Acute cystitis with hematuria: Secondary | ICD-10-CM

## 2018-05-02 DIAGNOSIS — R8290 Unspecified abnormal findings in urine: Secondary | ICD-10-CM | POA: Diagnosis not present

## 2018-05-02 DIAGNOSIS — M549 Dorsalgia, unspecified: Secondary | ICD-10-CM | POA: Diagnosis not present

## 2018-05-02 MED ORDER — TRAMADOL HCL 50 MG PO TABS: ORAL_TABLET | ORAL | 0 refills | Status: AC

## 2018-05-02 NOTE — Patient Instructions (Addendum)
miralax up to twice a day as needed for constipation.   Tramadol as needed.

## 2018-05-02 NOTE — Progress Notes (Signed)
Subjective:    Patient ID: Madison Rollins, female    DOB: Sep 17, 1928, 84 y.o.   MRN: [id number]  HPI Patient is a 84 year old female who presents to the clinic with her daughter-in-law and daughter.  She is here to follow-up on mid back pain and bradycardia.  She was taken off her beta-blocker, metoprolol, and started on norvasc. Doing great. No problems or concerns. HR stays in 60's now. Continues to have a lot of mid back pain. One episode of PT with no pain improvement. Rates pain 8/10. She admits she is not having bowel movements and when she does there are very hard. She is trying symptomatic treatment with no relief. She has had some urine odor and hx of recurrent UTI's she wonders if could have UTI.   .. Active Ambulatory Problems    Diagnosis Date Noted  . Acute right-sided low back pain without sciatica 11/16/2016  . Right hip pain 11/16/2016  . Fall 11/16/2016  . COPD (chronic obstructive pulmonary disease) with chronic bronchitis ([place]) 12/17/2016  . Memory changes 12/17/2016  . Mild cognitive impairment 12/18/2016  . Right inguinal hernia 01/14/2017  . History of UTI 01/14/2017  . Right lower quadrant abdominal pain 01/14/2017  . Confusion 04/04/2017  . Systolic ejection murmur 38/75/6433  . Hypothyroidism 07/06/2017  . History of recurrent UTIs 07/06/2017  . Sundowning 07/08/2017  . Aortic regurgitation 08/03/2017  . Irritable 10/10/2017  . Palpitations 11/09/2017  . Candidal intertrigo 12/27/2017  . Lower leg edema 12/27/2017  . Depressed mood 03/17/2018  . Diarrhea 03/17/2018  . HTN (hypertension) 04/27/2018  . Constipation 05/08/2018  . Mid back pain 05/08/2018   Resolved Ambulatory Problems    Diagnosis Date Noted  . Pleural effusion on left 12/01/2016  . Right flank pain 01/14/2017   Past Medical History:  Diagnosis Date  . Cancer ([place])   . Dementia   . Frequency of urination   . Hyperlipidemia   . Hypertension   . Thyroid disease       Review of  Systems See HPI.     Objective:   Physical Exam  Constitutional: She is oriented to person, place, and time. She appears well-developed and well-nourished.  HENT:  Head: Normocephalic and atraumatic.  Cardiovascular: Normal rate and regular rhythm.  Pulmonary/Chest: Effort normal and breath sounds normal.  No CVA tenderness.   Abdominal: Soft. Bowel sounds are normal. She exhibits no distension and no mass. There is no tenderness. There is no rebound and no guarding. No hernia.  Musculoskeletal:  Tenderness over lumbar spine to palpation around L4. Decreased ROM at waist due to pain in all directions.  Negative straight leg test.   Neurological: She is alert and oriented to person, place, and time.  Psychiatric: She has a normal mood and affect. Her behavior is normal.          Assessment & Plan:  Marland KitchenMarland KitchenDiagnoses and all orders for this visit:  Mid back pain -     traMADol (ULTRAM) 50 MG tablet; Take one to two tablets every 4-6hrs as needed for pain.  Bradycardia  Constipation, unspecified constipation type  Urine abnormality -     Urine Culture -     nitrofurantoin, macrocrystal-monohydrate, (MACROBID) 100 MG capsule; Take 1 capsule (100 mg total) by mouth 2 (two) times daily.  Acute cystitis with hematuria -     nitrofurantoin, macrocrystal-monohydrate, (MACROBID) 100 MG capsule; Take 1 capsule (100 mg total) by mouth 2 (two) times daily.   .. Results  for orders placed or performed in visit on 05/02/18  Urine Culture  Result Value Ref Range   MICRO NUMBER: [id number]    SPECIMEN QUALITY: ADEQUATE    Sample Source URINE    STATUS: FINAL    ISOLATE 1: Escherichia coli (A)       Susceptibility   Escherichia coli - URINE CULTURE, REFLEX    AMOX/CLAVULANIC 4 Sensitive     AMPICILLIN 4 Sensitive     AMPICILLIN/SULBACTAM <=2 Sensitive     CEFAZOLIN* <=4 Not Reportable      * For infections other than uncomplicated UTIcaused by E. coli, K. pneumoniae or P.  mirabilis:Cefazolin is resistant if MIC > or = 8 mcg/mL.(Distinguishing susceptible versus intermediatefor isolates with MIC < or = 4 mcg/mL requiresadditional testing.)For uncomplicated UTI caused by E. coli,K. pneumoniae or P. mirabilis: Cefazolin issusceptible if MIC <32 mcg/mL and predictssusceptible to the oral agents cefaclor, cefdinir,cefpodoxime, cefprozil, cefuroxime, cephalexinand loracarbef.    CEFEPIME <=1 Sensitive     CEFTRIAXONE <=1 Sensitive     CIPROFLOXACIN <=0.25 Sensitive     LEVOFLOXACIN <=0.12 Sensitive     ERTAPENEM <=0.5 Sensitive     GENTAMICIN <=1 Sensitive     IMIPENEM <=0.25 Sensitive     NITROFURANTOIN <=16 Sensitive     PIP/TAZO <=4 Sensitive     TOBRAMYCIN <=1 Sensitive     TRIMETH/SULFA* <=20 Sensitive      * For infections other than uncomplicated UTIcaused by E. coli, K. pneumoniae or P. mirabilis:Cefazolin is resistant if MIC > or = 8 mcg/mL.(Distinguishing susceptible versus intermediatefor isolates with MIC < or = 4 mcg/mL requiresadditional testing.)For uncomplicated UTI caused by E. coli,K. pneumoniae or P. mirabilis: Cefazolin issusceptible if MIC <32 mcg/mL and predictssusceptible to the oral agents cefaclor, cefdinir,cefpodoxime, cefprozil, cefuroxime, cephalexinand loracarbef.Legend:S = Susceptible  I = IntermediateR = Resistant  NS = Not susceptible* = Not tested  NR = Not reported**NN = See antimicrobic comments   Culture came back positive for UTI. Will confirm cleared infection once finished abx. May need to discuss prevention at this point. Last few appt she has had UTI with atypical symptoms.   Back pain still could be constipation driven. Discussed miralax. Continue with treating as lumbosacral strain with PT.   Tramadol given for pain control. Discussed side effects. [place] controlled substance database reviewed with no concerns.   HR stable today. Continue on same medicaiton dosing.

## 2018-05-04 ENCOUNTER — Ambulatory Visit (INDEPENDENT_AMBULATORY_CARE_PROVIDER_SITE_OTHER): Payer: Medicare Other | Admitting: Physical Therapy

## 2018-05-04 DIAGNOSIS — M545 Low back pain, unspecified: Secondary | ICD-10-CM

## 2018-05-04 MED ORDER — NITROFURANTOIN MONOHYD MACRO 100 MG PO CAPS: 100.0000 mg | ORAL_CAPSULE | Freq: Two times a day (BID) | ORAL | 0 refills | Status: DC

## 2018-05-04 NOTE — Progress Notes (Signed)
She can get a urine sample after finish abx to make sure infection is cleared and then we could consider a preventative.

## 2018-05-04 NOTE — Therapy (Signed)
[place], [place], [address] Phone: [phone]   Fax:  [phone]  Physical Therapy Treatment  Patient Details  Name: Madison Rollins MRN: [id number] Date of Birth: 1928-09-25 Referring Provider: Iran Planas, PA-C   Encounter Date: 05/04/2018  PT End of Session - 05/04/18 1513    Visit Number  2    Number of Visits  12    Date for PT Re-Evaluation  06/12/18    PT Start Time  1505    PT Stop Time  1600    PT Time Calculation (min)  55 min    Activity Tolerance  Patient tolerated treatment well    Behavior During Therapy  [facility] for tasks assessed/performed       Past Medical History:  Diagnosis Date  . Cancer ([place])   . Dementia   . Frequency of urination   . Hyperlipidemia   . Hypertension   . Thyroid disease     Past Surgical History:  Procedure Laterality Date  . BACK SURGERY    . bladder tacked  2010    There were no vitals filed for this visit.  Subjective Assessment - 05/04/18 1516    Subjective  "it's been rough".  Pt reports she doesn't feel any better than last visit.  Per her granddaughter, she is moving a little better than a few days ago.  She is doing exercises 1x/day.  She is using TENS, heat/ice, topical cream and pain meds.     Patient is accompained by:  Family member Pamala Hurry, granddaughter in-law    Pertinent History  mod dementia    Patient Stated Goals  quit hurting so much    Currently in Pain?  Yes    Pain Score  7     Pain Location  Back    Pain Orientation  Left;Right;Lower    Pain Descriptors / Indicators  Sharp    Aggravating Factors   any movement    Pain Relieving Factors  ?         [facility] PT Assessment - 05/04/18 0001      Assessment   Medical Diagnosis  LBP after fall    Referring Provider  Iran Planas, PA-C    Onset Date/Surgical Date  04/19/18    Next MD Visit  after therapy       [facility] Adult PT Treatment/Exercise - 05/04/18 0001       Lumbar Exercises: Stretches   Passive Hamstring Stretch  Right;Left;2 reps;30 seconds PTA assist    Single Knee to Chest Stretch  Right;Left;3 reps;20 seconds PTA assist      Lumbar Exercises: Standing   Other Standing Lumbar Exercises  Standing trunk ext (to neutral) with hands on counter x 4 reps;  reaching over head, sliding arms up cabinets x 5 reps, 2 sets, attempted reaching overhead to side, 1 rep each way (increased back pain; stopped);     Other Standing Lumbar Exercises  side stepping with UE support on counter x 10 ft R/L       Lumbar Exercises: Seated   Sit to Stand Limitations  2 reps - increased time to complete.     Other Seated Lumbar Exercises  transverse abdominal set x 10 sec x 6 reps     Other Seated Lumbar Exercises  seated marching x 10 steps, 2 sets      Lumbar Exercises: Supine   Other Supine Lumbar Exercises  decompression position x 3 min (increased pain to  get into position, settled down): reaching over head R/L arm then both arms x 3 reps       Modalities   Modalities  Electrical Stimulation;Moist Heat      Moist Heat Therapy   Number Minutes Moist Heat  15 Minutes    Moist Heat Location  Lumbar Spine pt in sidelying      Electrical Stimulation   Electrical Stimulation Location  bilat lumbar paraspinals    Electrical Stimulation Action  IFC    Electrical Stimulation Parameters  to tolerance    Electrical Stimulation Goals  Pain             PT Education - 05/04/18 1605    Education Details  HEP - trunk ext with back against wall.  reaching activities at counter.     Person(s) Educated  Theatre stage manager)    Methods  Explanation    Comprehension  Verbalized understanding          PT Long Term Goals - 05/04/18 1614      PT LONG TERM GOAL #1   Title  Pt will be I and compliant with HEP. 6 weeks 06/12/18    Status  On-going      PT LONG TERM GOAL #2   Title  Pt will improve FOTO score to less than 60% limited. 6 weeks 06/12/18    Status  On-going       PT LONG TERM GOAL #3   Title  Pt will improve Lumbar AROM to [facility]. 6 weeks 06/12/18    Status  On-going      PT LONG TERM GOAL #4   Title  Pt will be able to ambulate community distances with least restrictive AD. 6 weeks 06/12/18    Status  On-going      PT LONG TERM GOAL #5   Title  Pt will report less than 2-3/10 with ADL's including bathing, dressing, transfers    Status  On-going            Plan - 05/04/18 1614    Clinical Impression Statement  Pt required frequent rest breaks due to increased pain with ther ex.  She requires increased time for transitional movements.  Pt's caregiver (granddaughter) was given additional exercises to try with pt at home.      Rehab Potential  Fair    PT Frequency  2x / week    PT Duration  6 weeks    PT Treatment/Interventions  ADLs/Self Care Home Management;Electrical Stimulation;Moist Heat;Therapeutic exercise;Functional mobility training;Balance training;Neuromuscular re-education;Patient/family education;Manual techniques    PT Next Visit Plan  continue gentle ROM/ stretching, gait/ balance; modalities as indicated.     Consulted and Agree with Plan of Care  Patient       Patient will benefit from skilled therapeutic intervention in order to improve the following deficits and impairments:  Decreased activity tolerance, Decreased balance, Decreased mobility, Decreased range of motion, Decreased safety awareness, Decreased strength, Difficulty walking, Hypomobility, Increased muscle spasms, Postural dysfunction, Pain  Visit Diagnosis: Acute bilateral low back pain without sciatica     Problem List Patient Active Problem List   Diagnosis Date Noted  . HTN (hypertension) 04/27/2018  . Depressed mood 03/17/2018  . Diarrhea 03/17/2018  . Candidal intertrigo 12/27/2017  . Lower leg edema 12/27/2017  . Palpitations 11/09/2017  . Irritable 10/10/2017  . Aortic regurgitation 08/03/2017  . Sundowning 07/08/2017  . Systolic ejection  murmur 07/06/2017  . Hypothyroidism 07/06/2017  . History of recurrent UTIs 07/06/2017  .  Confusion 04/04/2017  . Right inguinal hernia 01/14/2017  . History of UTI 01/14/2017  . Right lower quadrant abdominal pain 01/14/2017  . Mild cognitive impairment 12/18/2016  . COPD (chronic obstructive pulmonary disease) with chronic bronchitis ([place]) 12/17/2016  . Memory changes 12/17/2016  . Acute right-sided low back pain without sciatica 11/16/2016  . Right hip pain 11/16/2016  . Fall 11/16/2016   Kerin Perna, PTA 05/04/18 4:21 PM  [place], [place], [address] Phone: [phone]   Fax:  [phone]  Name: Madison Rollins MRN: [id number] Date of Birth: December 19, 1927

## 2018-05-04 NOTE — Progress Notes (Signed)
Call pt: E.coli found in urine. She does have UTI. Will send antibiotic to start.

## 2018-05-05 LAB — URINE CULTURE
MICRO NUMBER:: 90901082
SPECIMEN QUALITY: ADEQUATE

## 2018-05-08 ENCOUNTER — Other Ambulatory Visit: Payer: Self-pay | Admitting: *Deleted

## 2018-05-08 DIAGNOSIS — R0602 Shortness of breath: Secondary | ICD-10-CM

## 2018-05-08 DIAGNOSIS — R4589 Other symptoms and signs involving emotional state: Secondary | ICD-10-CM

## 2018-05-08 DIAGNOSIS — J449 Chronic obstructive pulmonary disease, unspecified: Secondary | ICD-10-CM

## 2018-05-08 DIAGNOSIS — G3184 Mild cognitive impairment, so stated: Secondary | ICD-10-CM

## 2018-05-08 DIAGNOSIS — F05 Delirium due to known physiological condition: Secondary | ICD-10-CM

## 2018-05-08 DIAGNOSIS — K5900 Constipation, unspecified: Secondary | ICD-10-CM | POA: Insufficient documentation

## 2018-05-08 DIAGNOSIS — E039 Hypothyroidism, unspecified: Secondary | ICD-10-CM

## 2018-05-08 DIAGNOSIS — J4489 Other specified chronic obstructive pulmonary disease: Secondary | ICD-10-CM

## 2018-05-08 DIAGNOSIS — M549 Dorsalgia, unspecified: Secondary | ICD-10-CM | POA: Insufficient documentation

## 2018-05-08 DIAGNOSIS — R413 Other amnesia: Secondary | ICD-10-CM

## 2018-05-08 DIAGNOSIS — F329 Major depressive disorder, single episode, unspecified: Secondary | ICD-10-CM

## 2018-05-08 MED ORDER — SOLIFENACIN SUCCINATE 5 MG PO TABS: ORAL_TABLET | ORAL | 1 refills | Status: DC

## 2018-05-08 MED ORDER — SYNTHROID 100 MCG PO TABS: ORAL_TABLET | ORAL | 1 refills | Status: DC

## 2018-05-08 MED ORDER — SERTRALINE HCL 50 MG PO TABS: ORAL_TABLET | ORAL | 1 refills | Status: DC

## 2018-05-08 MED ORDER — MONTELUKAST SODIUM 10 MG PO TABS: 10.0000 mg | ORAL_TABLET | Freq: Every day | ORAL | 3 refills | Status: AC

## 2018-05-08 MED ORDER — MEMANTINE HCL 5 MG PO TABS: ORAL_TABLET | ORAL | 1 refills | Status: DC

## 2018-05-08 MED ORDER — DONEPEZIL HCL 10 MG PO TABS: 10.0000 mg | ORAL_TABLET | Freq: Every day | ORAL | 1 refills | Status: DC

## 2018-05-09 ENCOUNTER — Ambulatory Visit (INDEPENDENT_AMBULATORY_CARE_PROVIDER_SITE_OTHER): Payer: Medicare Other | Admitting: Physical Therapy

## 2018-05-09 ENCOUNTER — Ambulatory Visit: Payer: Medicare Other | Admitting: Physician Assistant

## 2018-05-09 DIAGNOSIS — M545 Low back pain, unspecified: Secondary | ICD-10-CM

## 2018-05-09 NOTE — Therapy (Signed)
[place], [place], [address] Phone: [phone]   Fax:  [phone]  Physical Therapy Treatment  Patient Details  Name: Madison Rollins MRN: [id number] Date of Birth: [birth year] Referring Provider: Iran Planas, PA-C   Encounter Date: 05/09/2018  PT End of Session - 05/09/18 1248    Visit Number  3    Number of Visits  12    Date for PT Re-Evaluation  06/12/18    PT Start Time  1150    PT Stop Time  1225    PT Time Calculation (min)  35 min    Activity Tolerance  Patient tolerated treatment well    Behavior During Therapy  [facility] for tasks assessed/performed       Past Medical History:  Diagnosis Date  . Cancer ([place])   . Dementia   . Frequency of urination   . Hyperlipidemia   . Hypertension   . Thyroid disease     Past Surgical History:  Procedure Laterality Date  . BACK SURGERY    . bladder tacked  2010    There were no vitals filed for this visit.  Subjective Assessment - 05/09/18 1235    Subjective  Per pt's granddaughter (caregiver), pt fell yesterday, but prior to that she had noticed that her ROM and mobility had improved.  She has been unsteady and "off" today.  They have been putting a TENS unit and heat on her back and trying to complete exercises when able.  She reports that Dallys is about 60% back to her baseline.       Pertinent History  mod dementia    Patient Stated Goals  quit hurting so much    Currently in Pain?  Yes    Pain Score  2     Pain Location  Back    Pain Orientation  Right;Left;Lower    Aggravating Factors   standing too long    Pain Relieving Factors  heating pad         [facility] PT Assessment - 05/09/18 0001      Assessment   Medical Diagnosis  LBP after fall    Referring Provider  Iran Planas, PA-C    Onset Date/Surgical Date  04/19/18      AROM   AROM Assessment Site  Lumbar    Lumbar Flexion  -- not tested for safety    Lumbar Extension  to  neutral     Lumbar - Right Side Bend  WNL, with pain    Lumbar - Left Side Bend  WNL, with pain    Lumbar - Right Rotation  75% available    Lumbar - Left Rotation  50% available.        [facility] Adult PT Treatment/Exercise - 05/09/18 0001      Lumbar Exercises: Stretches   Passive Hamstring Stretch  Right;Left;3 reps;30 seconds PTA assist, in seated position; cues for posture    Standing Extension  10 reps;5 seconds back against wall, lifting hips forward.       Lumbar Exercises: Aerobic   Other Aerobic Exercise  1 lap around gym for warm up (with RW, SBA) cues to stand closer to walker      Lumbar Exercises: Standing   Other Standing Lumbar Exercises  mini-squats with RW x 8;  Wall slides (mini) x 10     Other Standing Lumbar Exercises  Side stepping at counter x 10 ft Rt/Lt x 2 reps;  forward walking /  retro gait x 10 ft each x 3 reps (granddaughter give instructions on guarding pt; returned demo).   Standing tall at counter with tactile cues for posture - cues given to granddaughter; able to return demo - verbally reviewed existing HEP with granddaughter.        Lumbar Exercises: Seated   Sit to Stand Limitations  5 reps (total; rest  breaks in between) - increased time to complete.     Other Seated Lumbar Exercises  transverse abdominal set x 10 sec x 2 reps    Other Seated Lumbar Exercises  shoulder rolls and upright posture          PT Long Term Goals - 05/09/18 1232      PT LONG TERM GOAL #1   Title  Pt will be I and compliant with HEP. 6 weeks 06/12/18    Status  On-going needs cues from caregiver      PT LONG TERM GOAL #2   Title  Pt will improve FOTO score to less than 60% limited. 6 weeks 06/12/18    Status  On-going      PT LONG TERM GOAL #3   Title  Pt will improve Lumbar AROM to [facility]. 6 weeks 06/12/18    Status  Partially Met      PT LONG TERM GOAL #4   Title  Pt will be able to ambulate community distances with least restrictive AD. 6 weeks 06/12/18    Status   Partially Met      PT LONG TERM GOAL #5   Title  Pt will report less than 2-3/10 with ADL's including bathing, dressing, transfers    Status  Achieved per granddaugther            Plan - 05/09/18 1237    Clinical Impression Statement  Pt had minor difficulty following commands today;required tactile cues for improved performance of exercise.  She required close SBA for safety in standing. Pt seen smiling and laughing throughout session, only grimacing with attempts to stretch Lt hamstring, as well has when bending Rt knee.  Pt has partially met LTG #3 and 4 and has met #5.     Rehab Potential  Fair    PT Frequency  2x / week    PT Duration  6 weeks    PT Treatment/Interventions  ADLs/Self Care Home Management;Electrical Stimulation;Moist Heat;Therapeutic exercise;Functional mobility training;Balance training;Neuromuscular re-education;Patient/family education;Manual techniques    PT Next Visit Plan  continue gentle ROM/ stretching, gait/ balance; modalities as indicated.     Consulted and Agree with Plan of Care  Patient;Family member/caregiver       Patient will benefit from skilled therapeutic intervention in order to improve the following deficits and impairments:  Decreased activity tolerance, Decreased balance, Decreased mobility, Decreased range of motion, Decreased safety awareness, Decreased strength, Difficulty walking, Hypomobility, Increased muscle spasms, Postural dysfunction, Pain  Visit Diagnosis: Acute bilateral low back pain without sciatica     Problem List Patient Active Problem List   Diagnosis Date Noted  . Constipation 05/08/2018  . Mid back pain 05/08/2018  . HTN (hypertension) 04/27/2018  . Depressed mood 03/17/2018  . Diarrhea 03/17/2018  . Candidal intertrigo 12/27/2017  . Lower leg edema 12/27/2017  . Palpitations 11/09/2017  . Irritable 10/10/2017  . Aortic regurgitation 08/03/2017  . Sundowning 07/08/2017  . Systolic ejection murmur 24/82/5003   . Hypothyroidism 07/06/2017  . History of recurrent UTIs 07/06/2017  . Confusion 04/04/2017  . Right inguinal hernia 01/14/2017  .  History of UTI 01/14/2017  . Right lower quadrant abdominal pain 01/14/2017  . Mild cognitive impairment 12/18/2016  . COPD (chronic obstructive pulmonary disease) with chronic bronchitis ([place]) 12/17/2016  . Memory changes 12/17/2016  . Acute right-sided low back pain without sciatica 11/16/2016  . Right hip pain 11/16/2016  . Fall 11/16/2016   Kerin Perna, PTA 05/09/18 12:51 PM  [place], [place], [address] Phone: [phone]   Fax:  [phone]  Name: Kellene Mccleary MRN: [id number] Date of Birth: 08-15-1928

## 2018-05-11 ENCOUNTER — Ambulatory Visit (INDEPENDENT_AMBULATORY_CARE_PROVIDER_SITE_OTHER): Payer: Medicare Other | Admitting: Physical Therapy

## 2018-05-11 DIAGNOSIS — M545 Low back pain, unspecified: Secondary | ICD-10-CM

## 2018-05-11 NOTE — Therapy (Signed)
[place], [place], [address] Phone: [phone]   Fax:  [phone]  Physical Therapy Treatment  Patient Details  Name: Madison Rollins MRN: [id number] Date of Birth: March 16, 1928 Referring Provider: Iran Planas, PA-C   Encounter Date: 05/11/2018  PT End of Session - 05/11/18 1242    Visit Number  4    Number of Visits  12    Date for PT Re-Evaluation  06/12/18    PT Start Time  0938    PT Stop Time  1829    PT Time Calculation (min)  48 min . Last 10 min on heat   Activity Tolerance  Patient tolerated treatment well    Behavior During Therapy  [facility] for tasks assessed/performed       Past Medical History:  Diagnosis Date  . Cancer ([place])   . Dementia   . Frequency of urination   . Hyperlipidemia   . Hypertension   . Thyroid disease     Past Surgical History:  Procedure Laterality Date  . BACK SURGERY    . bladder tacked  2010    There were no vitals filed for this visit.  Subjective Assessment - 05/11/18 1227    Subjective  Pt relays she is not having much back pain anymore, she does have some minor complaints of Rt leg pain. Her grandaughter does relay she is still very unsteady with standing and walking.    Patient is accompained by:  Family member    Pertinent History  mod dementia    Currently in Pain?  Yes    Pain Score  1     Pain Location  Back                       [facility] Adult PT Treatment/Exercise - 05/11/18 0001      Neuro Re-ed    Neuro Re-ed Details   balance training      Lumbar Exercises: Stretches   Passive Hamstring Stretch  Right;Left;3 reps;30 seconds    Standing Extension  10 reps;5 seconds      Lumbar Exercises: Aerobic   Other Aerobic Exercise  2 laps around gym for warm up (with RW, SBA) cues to stand closer to walker      Lumbar Exercises: Standing   Other Standing Lumbar Exercises  mini-squats with RW x 10;  Wall slides X15 with towel at  counter    Other Standing Lumbar Exercises  Side stepping at counter x 10 ft Rt/Lt x 3 reps ea;  forward walking / retro gait x 10 ft each x 3 reps. Staning balance mod tandem and Feet apart Eyes closed 30 sec X 2 ea.      Lumbar Exercises: Seated   Sit to Stand Limitations  5 reps no rest break needed today but did have Rt knee pain complaint on last rep    Other Seated Lumbar Exercises  seated rows red X20      Moist Heat Therapy   Number Minutes Moist Heat  10 Minutes    Moist Heat Location  Lumbar Spine             PT Education - 05/11/18 1242    Education Details  caregiver Edu for new balance exercises to add in at home    Person(s) Educated  Patient;Caregiver(s)    Methods  Explanation;Demonstration;Verbal cues;Tactile cues    Comprehension  Verbalized understanding  PT Long Term Goals - 05/09/18 1232      PT LONG TERM GOAL #1   Title  Pt will be I and compliant with HEP. 6 weeks 06/12/18    Status  On-going      PT LONG TERM GOAL #2   Title  Pt will improve FOTO score to less than 60% limited. 6 weeks 06/12/18    Status  On-going      PT LONG TERM GOAL #3   Title  Pt will improve Lumbar AROM to [facility]. 6 weeks 06/12/18    Status  Partially Met      PT LONG TERM GOAL #4   Title  Pt will be able to ambulate community distances with least restrictive AD. 6 weeks 06/12/18    Status  Partially Met      PT LONG TERM GOAL #5   Title  Pt will report less than 2-3/10 with ADL's including bathing, dressing, transfers    Status  Achieved            Plan - 05/11/18 1243    Clinical Impression Statement  Pt had better overall session today with following commands, activity tolerance, balance, and pain. She was able to ambulate further and progress balance training with only overall SBA for safety. Pt is not having much back pain anymore but continues to be unsteady on her feet so she will continue to benefit from PT.    Rehab Potential  Fair    PT Frequency  2x /  week    PT Duration  6 weeks    PT Treatment/Interventions  ADLs/Self Care Home Management;Electrical Stimulation;Moist Heat;Therapeutic exercise;Functional mobility training;Balance training;Neuromuscular re-education;Patient/family education;Manual techniques    PT Next Visit Plan  continue gentle ROM/ stretching, gait/ balance; modalities as indicated.     Consulted and Agree with Plan of Care  Patient;Family member/caregiver       Patient will benefit from skilled therapeutic intervention in order to improve the following deficits and impairments:  Decreased activity tolerance, Decreased balance, Decreased mobility, Decreased range of motion, Decreased safety awareness, Decreased strength, Difficulty walking, Hypomobility, Increased muscle spasms, Postural dysfunction, Pain  Visit Diagnosis: Acute bilateral low back pain without sciatica     Problem List Patient Active Problem List   Diagnosis Date Noted  . Constipation 05/08/2018  . Mid back pain 05/08/2018  . HTN (hypertension) 04/27/2018  . Depressed mood 03/17/2018  . Diarrhea 03/17/2018  . Candidal intertrigo 12/27/2017  . Lower leg edema 12/27/2017  . Palpitations 11/09/2017  . Irritable 10/10/2017  . Aortic regurgitation 08/03/2017  . Sundowning 07/08/2017  . Systolic ejection murmur 23/34/3568  . Hypothyroidism 07/06/2017  . History of recurrent UTIs 07/06/2017  . Confusion 04/04/2017  . Right inguinal hernia 01/14/2017  . History of UTI 01/14/2017  . Right lower quadrant abdominal pain 01/14/2017  . Mild cognitive impairment 12/18/2016  . COPD (chronic obstructive pulmonary disease) with chronic bronchitis ([place]) 12/17/2016  . Memory changes 12/17/2016  . Acute right-sided low back pain without sciatica 11/16/2016  . Right hip pain 11/16/2016  . Fall 11/16/2016    Debbe Odea, PT, DPT 05/11/2018, 12:46 PM  [facility] [place],  [place], [address] Phone: [phone]   Fax:  [phone]  Name: Madison Rollins MRN: [id number] Date of Birth: 1928-06-08

## 2018-05-15 ENCOUNTER — Encounter: Payer: Self-pay | Admitting: Physician Assistant

## 2018-05-15 ENCOUNTER — Ambulatory Visit (INDEPENDENT_AMBULATORY_CARE_PROVIDER_SITE_OTHER): Payer: Medicare Other | Admitting: Physician Assistant

## 2018-05-15 VITALS — BP 120/51 | HR 57 | Ht 65.0 in | Wt 148.0 lb

## 2018-05-15 DIAGNOSIS — N390 Urinary tract infection, site not specified: Secondary | ICD-10-CM

## 2018-05-15 DIAGNOSIS — R55 Syncope and collapse: Secondary | ICD-10-CM | POA: Diagnosis not present

## 2018-05-15 DIAGNOSIS — M545 Low back pain, unspecified: Secondary | ICD-10-CM

## 2018-05-15 NOTE — Progress Notes (Signed)
Subjective:    Patient ID: Madison Rollins, female    DOB: [birth year], 84 y.o.   MRN: [id number]  HPI  Pt is a 84 yo female with dementia who is accompanied by daughter and granddaughter for follow up on ED visit 05/08/18 for syncope. Unclear if syncope or fall. Granddaughter found her on the floor next to the bed and she had urinated on herself. Labs done and normal. Lumbar xray, CT head, EKG no acute findings.   She just finished abx macrobid for UTI. She has had some recurrent UTI's recently. Denies any symptoms today. Her low back is also much better as well. Pain has cleared. PT helping.     .. Active Ambulatory Problems    Diagnosis Date Noted  . Acute right-sided low back pain without sciatica 11/16/2016  . Right hip pain 11/16/2016  . Fall 11/16/2016  . COPD (chronic obstructive pulmonary disease) with chronic bronchitis ([place]) 12/17/2016  . Memory changes 12/17/2016  . Mild cognitive impairment 12/18/2016  . Right inguinal hernia 01/14/2017  . History of UTI 01/14/2017  . Right lower quadrant abdominal pain 01/14/2017  . Confusion 04/04/2017  . Systolic ejection murmur 50/06/3817  . Hypothyroidism 07/06/2017  . History of recurrent UTIs 07/06/2017  . Sundowning 07/08/2017  . Aortic regurgitation 08/03/2017  . Irritable 10/10/2017  . Palpitations 11/09/2017  . Candidal intertrigo 12/27/2017  . Lower leg edema 12/27/2017  . Depressed mood 03/17/2018  . Diarrhea 03/17/2018  . HTN (hypertension) 04/27/2018  . Constipation 05/08/2018  . Mid back pain 05/08/2018  . Syncope and collapse 05/18/2018   Resolved Ambulatory Problems    Diagnosis Date Noted  . Pleural effusion on left 12/01/2016  . Right flank pain 01/14/2017   Past Medical History:  Diagnosis Date  . Cancer ([place])   . Dementia   . Frequency of urination   . Hyperlipidemia   . Hypertension   . Thyroid disease       Review of Systems  All other systems reviewed and are negative.      Objective:   Physical Exam  Constitutional: She is oriented to person, place, and time. She appears well-developed and well-nourished.  HENT:  Head: Normocephalic and atraumatic.  Cardiovascular: Regular rhythm.  Sinus bradycardia  Pulmonary/Chest: Effort normal and breath sounds normal.  No CVA tenderness.   Abdominal: Soft. Bowel sounds are normal. She exhibits no distension. There is no tenderness.  Neurological: She is alert and oriented to person, place, and time.  Psychiatric: She has a normal mood and affect. Her behavior is normal.          Assessment & Plan:  Marland KitchenMarland KitchenDiagnoses and all orders for this visit:  Syncope and collapse  Recurrent UTI  Acute bilateral low back pain without sciatica   Unclear if syncope. She is a high fall risk. Discussed with granddaughter and monitor in her room to know when she gets up. Consider a bell policy for bathroom in the middle of the night. Let us know if happens again. Fall was right after UTI dx. Could have been some mental status changes from UTI.BP was also low today. Could be some BP drops causing dizziness and fall. Cut norvasc in half. Recheck in 3 months.   Get urine culture to make sure infection has completely cleared. If symptoms return we may need to consider daily preventative.   Finish PT on low back due to great response. Keep up stretching at home.   Mood seemed a little down today. Discussed  fun things to do.

## 2018-05-15 NOTE — Patient Instructions (Addendum)
Cut norvasc in half due to low blood pressure being a little low.  Follow up in 3 months.

## 2018-05-16 ENCOUNTER — Ambulatory Visit (INDEPENDENT_AMBULATORY_CARE_PROVIDER_SITE_OTHER): Payer: Medicare Other | Admitting: Physical Therapy

## 2018-05-16 DIAGNOSIS — M545 Low back pain, unspecified: Secondary | ICD-10-CM

## 2018-05-16 NOTE — Therapy (Addendum)
[place], [place], [address] Phone: [phone]   Fax:  [phone]  Physical Therapy Treatment/Discharge  Patient Details  Name: Madison Rollins MRN: [id number] Date of Birth: 12/26/1927 Referring Provider: Iran Planas, PA-C   Encounter Date: 05/16/2018  PT End of Session - 05/16/18 1157    Visit Number  5    Number of Visits  12    Date for PT Re-Evaluation  06/12/18    PT Start Time  5176    PT Stop Time  1220    PT Time Calculation (min)  32 min    Activity Tolerance  Patient tolerated treatment well    Behavior During Therapy  [facility] for tasks assessed/performed       Past Medical History:  Diagnosis Date  . Cancer ([place])   . Dementia   . Frequency of urination   . Hyperlipidemia   . Hypertension   . Thyroid disease     Past Surgical History:  Procedure Laterality Date  . BACK SURGERY    . bladder tacked  2010    There were no vitals filed for this visit.  Subjective Assessment - 05/16/18 1303    Subjective  "I'm just feeling different".   Pt reports she doesn't have much back pain anymore.  Per her granddaughter, pt is up and moving around house like she would prior to injury.  She is forgetting to use walker and needs constant reminding.  Granddaughter feels her balance is much better with RW.     Currently in Pain?  No/denies    Pain Score  0-No pain         [facility] PT Assessment - 05/16/18 0001      Assessment   Medical Diagnosis  LBP after fall    Referring Provider  Iran Planas, PA-C    Onset Date/Surgical Date  04/19/18      Observation/Other Assessments   Focus on Therapeutic Outcomes (FOTO)   39% limited.       AROM   Lumbar Flexion  --   not tested for safety   Lumbar Extension  to netural     Lumbar - Right Side Bend  WNL, sore at end range    Lumbar - Left Side Bend  WNL, sore at end range    Lumbar - Right Rotation  WNL    Lumbar - Left Rotation  WNL        [facility] Adult PT Treatment/Exercise - 05/16/18 0001      Lumbar Exercises: Stretches   Passive Hamstring Stretch  Right;Left;2 reps;30 seconds   seated    Standing Extension  2 reps;5 seconds    Quad Stretch  Right;Left;2 reps   seated   Other Lumbar Stretch Exercise  Overhead reach x 5 sec x 4 reps,  Overhead stretch single arm with leaning L/ R x 3 reps of 5 sec each.       Lumbar Exercises: Seated   Long Arc Quad on Chair  Right;Left;1 set;10 reps    Sit to Stand Limitations  8 reps, without rest breaks, occasional cues for safe hand placement and controlled descent.     Other Seated Lumbar Exercises  seated marching x 20      Knee/Hip Exercises: Standing   Other Standing Knee Exercises  side stepping Lt/ Rt  10 ft x 2 reps with light UE support;  trunk ext with sliding hands up and down on cabinets x 5 reps  Other Standing Knee Exercises  semi-tandem stance x 20 sec x 2 reps each leg forward, occasional UE support to steady (SBA for safety); tandem walking with light UE support on counter x 10 ft x 2 reps each direction.       Modalities   Modalities  --   pt and family declined; no pain       PT Long Term Goals - 05/16/18 1158      PT LONG TERM GOAL #1   Title  Pt will be I and compliant with HEP. 6 weeks 06/12/18    Status  On-going      PT LONG TERM GOAL #2   Title  Pt will improve FOTO score to less than 60% limited. 6 weeks 06/12/18    Status  Achieved      PT LONG TERM GOAL #3   Title  Pt will improve Lumbar AROM to [facility]. 6 weeks 06/12/18    Status  Partially Met      PT LONG TERM GOAL #4   Title  Pt will be able to ambulate community distances with least restrictive AD. 6 weeks 06/12/18    Status  Achieved      PT LONG TERM GOAL #5   Title  Pt will report less than 2-3/10 with ADL's including bathing, dressing, transfers    Status  Achieved            Plan - 05/16/18 1241    Clinical Impression Statement  Pt observed ambulating with RW with great ease  and improved posture.  She reported some increased in soreness with overhead arm stretches and LLE seated stretches. Per her granddaughter, pt has now returned to her baseline. FOTO score has improved to 39% limited. She has partially met her goals.   Pt and her granddaughter request to hold therapy for 2 weeks while she continues to work on [employer].     Rehab Potential  Fair    PT Frequency  2x / week    PT Duration  6 weeks    PT Treatment/Interventions  ADLs/Self Care Home Management;Electrical Stimulation;Moist Heat;Therapeutic exercise;Functional mobility training;Balance training;Neuromuscular re-education;Patient/family education;Manual techniques    PT Next Visit Plan  spoke to supervising PT regarding pt's progress: will hold therapy until 8/29; if pt doesn't return by then, will d/c.       Consulted and Agree with Plan of Care  Patient;Family member/caregiver    Family Member Consulted  pt's daughter and granddaughter.        Patient will benefit from skilled therapeutic intervention in order to improve the following deficits and impairments:  Decreased activity tolerance, Decreased balance, Decreased mobility, Decreased range of motion, Decreased safety awareness, Decreased strength, Difficulty walking, Hypomobility, Increased muscle spasms, Postural dysfunction, Pain  Visit Diagnosis: Acute bilateral low back pain without sciatica     Problem List Patient Active Problem List   Diagnosis Date Noted  . Constipation 05/08/2018  . Mid back pain 05/08/2018  . HTN (hypertension) 04/27/2018  . Depressed mood 03/17/2018  . Diarrhea 03/17/2018  . Candidal intertrigo 12/27/2017  . Lower leg edema 12/27/2017  . Palpitations 11/09/2017  . Irritable 10/10/2017  . Aortic regurgitation 08/03/2017  . Sundowning 07/08/2017  . Systolic ejection murmur 73/41/9379  . Hypothyroidism 07/06/2017  . History of recurrent UTIs 07/06/2017  . Confusion 04/04/2017  . Right inguinal hernia 01/14/2017   . History of UTI 01/14/2017  . Right lower quadrant abdominal pain 01/14/2017  . Mild cognitive impairment 12/18/2016  .  COPD (chronic obstructive pulmonary disease) with chronic bronchitis ([place]) 12/17/2016  . Memory changes 12/17/2016  . Acute right-sided low back pain without sciatica 11/16/2016  . Right hip pain 11/16/2016  . Fall 11/16/2016    Kerin Perna, PTA 05/16/18 1:05 PM  [facility] [place], [place], [address] Phone: [phone]   Fax:  [phone]  Name: Madison Rollins MRN: [id number] Date of Birth: 01-28-1928     PHYSICAL THERAPY DISCHARGE SUMMARY  Visits from Start of Care: 5  Current functional level related to goals / functional outcomes: See above   Remaining deficits: See above   Education / Equipment: HEP  Plan: Patient agrees to discharge.  Patient goals were partially met. Patient is being discharged due to being pleased with the current functional level.  ?????     Laureen Abrahams, PT, DPT 06/01/18 10:09 AM  [facility] [place], [place] [address]  [phone] (office) [phone] (fax)

## 2018-05-18 ENCOUNTER — Encounter: Payer: Self-pay | Admitting: Physician Assistant

## 2018-05-18 ENCOUNTER — Telehealth: Payer: Self-pay | Admitting: Physician Assistant

## 2018-05-18 ENCOUNTER — Encounter: Payer: Medicare Other | Admitting: Physical Therapy

## 2018-05-18 DIAGNOSIS — R55 Syncope and collapse: Secondary | ICD-10-CM | POA: Insufficient documentation

## 2018-05-18 NOTE — Telephone Encounter (Signed)
I don't see an order for her urine sample to make sure infection cleared? Do we know what happened?

## 2018-07-08 ENCOUNTER — Other Ambulatory Visit: Payer: Self-pay

## 2018-07-08 ENCOUNTER — Emergency Department (HOSPITAL_COMMUNITY)
Admission: EM | Admit: 2018-07-08 | Discharge: 2018-07-08 | Disposition: A | Discharge status: Home / Self Care | Payer: Medicare Other | Attending: Emergency Medicine | Admitting: Emergency Medicine

## 2018-07-08 ENCOUNTER — Encounter (HOSPITAL_COMMUNITY): Payer: Self-pay

## 2018-07-08 DIAGNOSIS — Z79899 Other long term (current) drug therapy: Secondary | ICD-10-CM | POA: Insufficient documentation

## 2018-07-08 DIAGNOSIS — J449 Chronic obstructive pulmonary disease, unspecified: Secondary | ICD-10-CM | POA: Diagnosis not present

## 2018-07-08 DIAGNOSIS — I10 Essential (primary) hypertension: Secondary | ICD-10-CM | POA: Insufficient documentation

## 2018-07-08 DIAGNOSIS — R001 Bradycardia, unspecified: Secondary | ICD-10-CM | POA: Insufficient documentation

## 2018-07-08 DIAGNOSIS — R1084 Generalized abdominal pain: Secondary | ICD-10-CM | POA: Diagnosis present

## 2018-07-08 DIAGNOSIS — E039 Hypothyroidism, unspecified: Secondary | ICD-10-CM | POA: Insufficient documentation

## 2018-07-08 DIAGNOSIS — R197 Diarrhea, unspecified: Secondary | ICD-10-CM | POA: Insufficient documentation

## 2018-07-08 LAB — URINALYSIS, ROUTINE W REFLEX MICROSCOPIC
Bacteria, UA: NONE SEEN
Bilirubin Urine: NEGATIVE
GLUCOSE, UA: NEGATIVE mg/dL
KETONES UR: 5 mg/dL — AB
Leukocytes, UA: NEGATIVE
NITRITE: NEGATIVE
PH: 6 (ref 5.0–8.0)
Protein, ur: NEGATIVE mg/dL
Specific Gravity, Urine: 1.014 (ref 1.005–1.030)

## 2018-07-08 LAB — COMPREHENSIVE METABOLIC PANEL
ALK PHOS: 83 U/L (ref 38–126)
ALT: 12 U/L (ref 0–44)
AST: 17 U/L (ref 15–41)
Albumin: 4.1 g/dL (ref 3.5–5.0)
Anion gap: 9 (ref 5–15)
BUN: 20 mg/dL (ref 8–23)
CALCIUM: 10 mg/dL (ref 8.9–10.3)
CO2: 29 mmol/L (ref 22–32)
Chloride: 103 mmol/L (ref 98–111)
Creatinine, Ser: 0.9 mg/dL (ref 0.44–1.00)
GFR, EST NON AFRICAN AMERICAN: 55 mL/min — AB (ref >60)
Glucose, Bld: 84 mg/dL (ref 70–99)
Potassium: 4.3 mmol/L (ref 3.5–5.1)
Sodium: 141 mmol/L (ref 135–145)
Total Bilirubin: 0.9 mg/dL (ref 0.3–1.2)
Total Protein: 7.7 g/dL (ref 6.5–8.1)

## 2018-07-08 LAB — CBC
HCT: 46.3 % — ABNORMAL HIGH (ref 36.0–46.0)
Hemoglobin: 15.3 g/dL — ABNORMAL HIGH (ref 12.0–15.0)
MCH: 30.8 pg (ref 26.0–34.0)
MCHC: 33 g/dL (ref 30.0–36.0)
MCV: 93.3 fL (ref 78.0–100.0)
PLATELETS: 184 10*3/uL (ref 150–400)
RBC: 4.96 MIL/uL (ref 3.87–5.11)
RDW: 13.5 % (ref 11.5–15.5)
WBC: 5.7 10*3/uL (ref 4.0–10.5)

## 2018-07-08 LAB — LIPASE, BLOOD: LIPASE: 38 U/L (ref 11–51)

## 2018-07-08 MED ORDER — TRAMADOL HCL 50 MG PO TABS
50.0000 mg | ORAL_TABLET | Freq: Once | ORAL | Status: AC
  Administered 2018-07-08: 50 mg via ORAL
  Filled 2018-07-08: qty 1

## 2018-07-08 MED ORDER — SODIUM CHLORIDE 0.9 % IV BOLUS
500.0000 mL | Freq: Once | INTRAVENOUS | Status: AC
  Administered 2018-07-08: 500 mL via INTRAVENOUS

## 2018-07-08 NOTE — ED Triage Notes (Signed)
Pt from home. Pt had a large liquid BM last night. Pt woke up this morning more lethargic than normal. Pt was given amlodipine, and family concerned she was dehydrated. Pt has had 3 episodes of diarrhea this week.

## 2018-07-08 NOTE — ED Provider Notes (Signed)
[facility] DEPT Provider Note   CSN: [id number] Arrival date & time: 07/08/18  1232     History   Chief Complaint Chief Complaint  Patient presents with  . Diarrhea    HPI Mylei Brackeen is a 84 y.o. female.  HPI  84 year old female who presents today with complaints of 2 days of crampy abdominal pain with diarrhea.  She had multiple bowel movements during the past several days.  Pain has been mild to moderate and crampy.  It has resolved today.  Today she has only had one loose bowel movement.  She has not had nausea or vomiting.  She has been taking p.o. fluids.  No fever or chills have been noted.  She lives with her granddaughter in [place] and grandson.  Her daughter is here at the bedside assisting with history.  She is on blood pressure medications and did take her blood pressure medication today.  She denies headache, head injury, chest pain, shortness of breath, blood in stool, or increased frequency of urination.  However, daughter states that she does frequently have urinary tract infections.  She has not been on antibiotics for several months.  No history of C. difficile.  Past Medical History:  Diagnosis Date  . Cancer ([place])   . Dementia ([place])   . Frequency of urination   . Hyperlipidemia   . Hypertension   . Thyroid disease     Patient Active Problem List   Diagnosis Date Noted  . Syncope and collapse 05/18/2018  . Constipation 05/08/2018  . Mid back pain 05/08/2018  . HTN (hypertension) 04/27/2018  . Depressed mood 03/17/2018  . Diarrhea 03/17/2018  . Candidal intertrigo 12/27/2017  . Lower leg edema 12/27/2017  . Palpitations 11/09/2017  . Irritable 10/10/2017  . Aortic regurgitation 08/03/2017  . Sundowning 07/08/2017  . Systolic ejection murmur 33/54/5625  . Hypothyroidism 07/06/2017  . History of recurrent UTIs 07/06/2017  . Confusion 04/04/2017  . Right inguinal hernia 01/14/2017  . History of UTI 01/14/2017  . Right  lower quadrant abdominal pain 01/14/2017  . Mild cognitive impairment 12/18/2016  . COPD (chronic obstructive pulmonary disease) with chronic bronchitis ([place]) 12/17/2016  . Memory changes 12/17/2016  . Acute right-sided low back pain without sciatica 11/16/2016  . Right hip pain 11/16/2016  . Fall 11/16/2016    Past Surgical History:  Procedure Laterality Date  . BACK SURGERY    . bladder tacked  2010     OB History   None      Home Medications    Prior to Admission medications   Medication Sig Start Date End Date Taking? Authorizing Provider  albuterol (PROVENTIL HFA;VENTOLIN HFA) 108 (90 Base) MCG/ACT inhaler Inhale 2 puffs into the lungs every 6 (six) hours as needed for wheezing or shortness of breath. 12/17/16   Breeback, Jade L, PA-C  AMBULATORY NON FORMULARY MEDICATION Walker or rolling walker use as needed for falls and gait.  Disp 1 S36.12A W19.xxxa 04/27/18   Gregor Hams, MD  amLODipine (NORVASC) 5 MG tablet Take 1 tablet (5 mg total) by mouth daily. 04/27/18   Gregor Hams, MD  clotrimazole-betamethasone (LOTRISONE) cream Apply 1 application topically 2 (two) times daily. 12/27/17   Breeback, Jade L, PA-C  diazepam (VALIUM) 5 MG tablet Take 1 tablet (5 mg total) by mouth at bedtime. Or in the evening for irritation. 01/31/18   Breeback, Jade L, PA-C  diclofenac sodium (VOLTAREN) 1 % GEL Apply 4 g topically 4 (four)  times daily. To affected joint. 02/03/18   Breeback, Royetta Car, PA-C  diphenoxylate-atropine (LOMOTIL) 2.5-0.025 MG tablet Take 1 tablet by mouth 4 (four) times daily as needed for diarrhea or loose stools. 03/15/18   Breeback, Jade L, PA-C  donepezil (ARICEPT) 10 MG tablet Take 1 tablet (10 mg total) by mouth at bedtime. 05/08/18   Breeback, Jade L, PA-C  memantine (NAMENDA) 5 MG tablet TAKE 1 TABLET(5 MG) BY MOUTH DAILY 05/08/18   Breeback, Jade L, PA-C  montelukast (SINGULAIR) 10 MG tablet Take 1 tablet (10 mg total) by mouth at bedtime. 05/08/18   Breeback, Jade L,  PA-C  sertraline (ZOLOFT) 50 MG tablet Take 1 and 1/2 tablet daily for mood. 05/08/18   Breeback, Jade L, PA-C  solifenacin (VESICARE) 5 MG tablet TAKE 1 TABLET(5 MG) BY MOUTH DAILY 05/08/18   Breeback, Jade L, PA-C  SYNTHROID 100 MCG tablet TAKE 1 TABLET BY MOUTH EVERY DAY BEFORE BREAKFAST 05/08/18   Breeback, Jade L, PA-C  traMADol (ULTRAM) 50 MG tablet Take one to two tablets every 4-6hrs as needed for pain. 05/02/18   Breeback, Royetta Car, PA-C  Vitamin D, Ergocalciferol, (DRISDOL) 50000 units CAPS capsule Take 1 capsule (50,000 Units total) by mouth every 7 (seven) days. 07/29/17   Donella Stade, PA-C    Family History Family History  Problem Relation Age of Onset  . Diabetes Brother   . Hyperlipidemia Mother   . Hypertension Mother   . Cancer Father        lung  . Cancer Sister        breast    Social History Social History   Tobacco Use  . Smoking status: Never Smoker  . Smokeless tobacco: Never Used  Substance Use Topics  . Alcohol use: No  . Drug use: No     Allergies   Hydrocodone-acetaminophen   Review of Systems Review of Systems   Physical Exam Updated Vital Signs BP 107/71 (BP Location: Right Arm)   Pulse (!) 56   Temp 98.7 F (37.1 C) (Oral) Comment: laying  Resp 14   Ht 1.676 m (5\' 6" )   Wt 54.4 kg   SpO2 95%   BMI 19.37 kg/m   Physical Exam  Constitutional: She appears well-developed and well-nourished.  HENT:  Head: Normocephalic and atraumatic.  Right Ear: External ear normal.  Left Ear: External ear normal.  Nose: Nose normal.  Mouth/Throat: Oropharynx is clear and moist.  Eyes: Pupils are equal, round, and reactive to light. EOM are normal.  Neck: Normal range of motion. Neck supple.  Cardiovascular: Bradycardia present.  Pulmonary/Chest: Effort normal and breath sounds normal.  Abdominal: Soft. Bowel sounds are normal. She exhibits no distension and no mass. There is no tenderness. There is no rebound and no guarding.  Musculoskeletal:  Normal range of motion. She exhibits no edema.  Neurological: She is alert. She displays normal reflexes. No cranial nerve deficit. She exhibits normal muscle tone. Coordination normal.  Alert and oriented to person place but not to date  Skin: Skin is warm and dry. Capillary refill takes less than 2 seconds. No rash noted.  Psychiatric: She has a normal mood and affect.  Nursing note and vitals reviewed.    ED Treatments / Results  Labs (all labs ordered are listed, but only abnormal results are displayed) Labs Reviewed  COMPREHENSIVE METABOLIC PANEL - Abnormal; Notable for the following components:      Result Value   GFR calc non Af Amer 55 (*)  All other components within normal limits  CBC - Abnormal; Notable for the following components:   Hemoglobin 15.3 (*)    HCT 46.3 (*)    All other components within normal limits  URINALYSIS, ROUTINE W REFLEX MICROSCOPIC - Abnormal; Notable for the following components:   Hgb urine dipstick SMALL (*)    Ketones, ur 5 (*)    All other components within normal limits  LIPASE, BLOOD    EKG EKG Interpretation  Date/Time:  Saturday July 08 2018 14:22:13 EDT Ventricular Rate:  52 PR Interval:    QRS Duration: 110 QT Interval:  508 QTC Calculation: 473 R Axis:   55 Text Interpretation:  Normal sinus rhythm Poor data quality No old tracing to compare Confirmed by Pattricia Boss [phone]) on 07/08/2018 2:33:00 PM   Radiology No results found.  Procedures Procedures (including critical care time)  Medications Ordered in ED Medications  sodium chloride 0.9 % bolus 500 mL (500 mLs Intravenous New Bag/Given 07/08/18 1425)  traMADol (ULTRAM) tablet 50 mg (50 mg Oral Given 07/08/18 1438)     Initial Impression / Assessment and Plan / ED Course  I have reviewed the triage vital signs and the nursing notes.  Pertinent labs & imaging results that were available during my care of the patient were reviewed by me and considered in my  medical decision making (see chart for details).     84 year old female who presents today with several days of diarrhea.  On evaluation here labs are normal.  She did clinically appear somewhat volume depleted and received 500 cc of normal saline.  Patient had a urinalysis obtained by clean-catch specimen that appears to be slightly contaminated and has 0-5 red blood cells 0-5 white blood cells and 0-5 squamous epithelium.  Otherwise does not appear to be infected and not think that patient being treated with antibiotics at, at this time, would be beneficial.  She has been able to take p.o. here and has not had any loose stools.  I discussed the results with the patient's daughter and granddaughter in law as well as the patient.  She appears stable for discharge.  Discussed return precautions and need for close follow-up and they voiced understanding. Of note, patient has been somewhat bradycardic 45. Review of patient's recent vital signs reveal her heart rate has been as low as 45 in the past.  Discussed with granddaughter in law, who is a Marine scientist here.  She states that she previously had been on a beta-blocker, but they had not noted what her baseline heart rate was off the beta-blocker.  With movement her heart rate does come back up.  I discussed return precautions and need for close follow-up and they voiced understanding. Final Clinical Impressions(s) / ED Diagnoses   Final diagnoses:  Diarrhea of presumed infectious origin  Bradycardia    ED Discharge Orders    None       Pattricia Boss, MD 07/08/18 [phone]

## 2018-07-08 NOTE — Discharge Instructions (Addendum)
Please continue to push liquids. Continue to monitor heart rate and have reevaluated if it is below 40 Recheck with her primary care doctor on Monday Return here if worse at any time

## 2018-07-28 ENCOUNTER — Ambulatory Visit (INDEPENDENT_AMBULATORY_CARE_PROVIDER_SITE_OTHER): Payer: Medicare Other | Admitting: Physician Assistant

## 2018-07-28 ENCOUNTER — Ambulatory Visit (INDEPENDENT_AMBULATORY_CARE_PROVIDER_SITE_OTHER): Payer: Medicare Other

## 2018-07-28 ENCOUNTER — Encounter: Payer: Self-pay | Admitting: Physician Assistant

## 2018-07-28 VITALS — BP 121/70 | HR 58 | Ht 66.0 in | Wt 145.0 lb

## 2018-07-28 DIAGNOSIS — Z87440 Personal history of urinary (tract) infections: Secondary | ICD-10-CM | POA: Diagnosis not present

## 2018-07-28 DIAGNOSIS — F05 Delirium due to known physiological condition: Secondary | ICD-10-CM

## 2018-07-28 DIAGNOSIS — G479 Sleep disorder, unspecified: Secondary | ICD-10-CM

## 2018-07-28 DIAGNOSIS — J90 Pleural effusion, not elsewhere classified: Secondary | ICD-10-CM

## 2018-07-28 DIAGNOSIS — W19XXXA Unspecified fall, initial encounter: Secondary | ICD-10-CM

## 2018-07-28 DIAGNOSIS — R0781 Pleurodynia: Secondary | ICD-10-CM | POA: Diagnosis not present

## 2018-07-28 DIAGNOSIS — F0390 Unspecified dementia without behavioral disturbance: Secondary | ICD-10-CM

## 2018-07-28 DIAGNOSIS — M545 Low back pain: Secondary | ICD-10-CM

## 2018-07-28 DIAGNOSIS — Z23 Encounter for immunization: Secondary | ICD-10-CM

## 2018-07-28 DIAGNOSIS — S3210XA Unspecified fracture of sacrum, initial encounter for closed fracture: Secondary | ICD-10-CM

## 2018-07-28 NOTE — Progress Notes (Signed)
Subjective:    Patient ID: Madison Rollins, female    DOB: [birth year], 84 y.o.   MRN: [id number]  HPI  Pt is a 84 yo female with dementia, recurrent UTI's who presents to the clinic after a fall 1 week ago early Friday morning. She is accompanied by daughter and grand daugther in law. She has been falling more frequent. Almost always at night. She is taking tylenol only. At times tramadol if needed. Pt denies any urinary symptoms, fever, chills. She is eating and drinking well. Her night time wandering is getting a little worse. She walks with an walker but when she falls she does not remember why she is not using walker.   .. Active Ambulatory Problems    Diagnosis Date Noted  . Acute right-sided low back pain without sciatica 11/16/2016  . Right hip pain 11/16/2016  . Fall 11/16/2016  . COPD (chronic obstructive pulmonary disease) with chronic bronchitis ([place]) 12/17/2016  . Memory changes 12/17/2016  . Mild cognitive impairment 12/18/2016  . Right inguinal hernia 01/14/2017  . History of UTI 01/14/2017  . Right lower quadrant abdominal pain 01/14/2017  . Confusion 04/04/2017  . Systolic ejection murmur 31/51/7616  . Hypothyroidism 07/06/2017  . History of recurrent UTIs 07/06/2017  . Sundowning 07/08/2017  . Aortic regurgitation 08/03/2017  . Irritable 10/10/2017  . Palpitations 11/09/2017  . Candidal intertrigo 12/27/2017  . Lower leg edema 12/27/2017  . Depressed mood 03/17/2018  . Diarrhea 03/17/2018  . HTN (hypertension) 04/27/2018  . Constipation 05/08/2018  . Mid back pain 05/08/2018  . Syncope and collapse 05/18/2018  . Trouble in sleeping 07/28/2018  . Dementia ([place]) 07/31/2018  . Closed compression fracture of sacrum ([place]) 07/31/2018   Resolved Ambulatory Problems    Diagnosis Date Noted  . Pleural effusion on left 12/01/2016  . Right flank pain 01/14/2017   Past Medical History:  Diagnosis Date  . Cancer ([place])   . Frequency of urination   . Hyperlipidemia    . Hypertension   . Thyroid disease       Review of Systems See HPI.     Objective:   Physical Exam  Constitutional: She is oriented to person, place, and time. She appears well-developed and well-nourished.  HENT:  Head: Normocephalic and atraumatic.  Cardiovascular: Regular rhythm.  bradycardia  Pulmonary/Chest: Effort normal. She has no wheezes.  Musculoskeletal:  Tenderness over right mid back into flank and around right posterior ribs with a hard mass on rib.   Neurological: She is alert and oriented to person, place, and time.  Skin:  Bruising over right mid back to flank area.   Psychiatric: She has a normal mood and affect. Her behavior is normal.          Assessment & Plan:  Marland KitchenMarland KitchenDiagnoses and all orders for this visit:  Fall, initial encounter -     DG Lumbar Spine Complete -     DG Ribs Unilateral W/Chest Right  Trouble in sleeping  Sundowning  History of UTI  Encounter for immunization -     Flu vaccine HIGH DOSE PF  Dementia without behavioral disturbance, unspecified dementia type ([place])  Closed compression fracture of sacrum, initial encounter ([place])   Falls likely associated with stability issues and wandering at night. Consider benadryl to see if helps her sleep some. They are considering sleeping in the same room or monitor to help keep an eye on her.  Xray order to look for any fractures.  Compression fracture at S1.  Right 9th rib old fracture with possible re-fracture. Continue to use tylenol for now. Tramadol for worsening pain. Ice regularly.  Make sure on vitamin D1000 units and calcium 1300mg .   BP a little low cut norvasc in half to 2.5mg . Follow up in 2 weeks nurse visit. Want to keep BP up to decrease fall risk.   Attempted to get urinary sample. Pt was not able to produce urine. If feeling weak or having any urinary symptoms/confusion worsening consider follow up to recheck urine due to hx of UTI.

## 2018-07-28 NOTE — Patient Instructions (Addendum)
Cut norvasc in half 2.5mg .  Benadryl at bedtime for sleep.

## 2018-07-31 DIAGNOSIS — F0390 Unspecified dementia without behavioral disturbance: Secondary | ICD-10-CM | POA: Insufficient documentation

## 2018-07-31 DIAGNOSIS — S3210XA Unspecified fracture of sacrum, initial encounter for closed fracture: Secondary | ICD-10-CM | POA: Insufficient documentation

## 2018-07-31 NOTE — Progress Notes (Signed)
Not sure why these came into my result box this morning. Deformity of knot on back seen on xray. It is where old fracture was and can not completely rule out refracture. Not displaced or any danger to puncture lungs.   Please see results of lumbar spine to report to patient as well.

## 2018-07-31 NOTE — Progress Notes (Signed)
There is a compression fracture of undetermined age of S1 of lower back. Certainly use tramadol as needed for pain. Does patient have a back brace she could use for more support while she is healing from fall.

## 2018-08-07 ENCOUNTER — Telehealth: Payer: Self-pay

## 2018-08-07 MED ORDER — AMBULATORY NON FORMULARY MEDICATION: 0 refills | Status: AC

## 2018-08-07 NOTE — Telephone Encounter (Signed)
Per imaging results:  "Notes recorded by Donella Stade, PA-C on 08/07/2018 at 12:23 AM EST Can we write her non amb rx for lumbar back support brace that could give her some relief with compression fracture. She can take to any medical supply stores."   RX printed and signed by [place]. Pamala Hurry advised RX up front to pick up.

## 2018-08-07 NOTE — Progress Notes (Signed)
Can we write her non amb rx for lumbar back support brace that could give her some relief with compression fracture. She can take to any medical supply stores.

## 2018-08-08 ENCOUNTER — Other Ambulatory Visit: Payer: Self-pay | Admitting: Family Medicine

## 2018-08-10 ENCOUNTER — Other Ambulatory Visit: Payer: Self-pay

## 2018-08-10 MED ORDER — AMLODIPINE BESYLATE 5 MG PO TABS: 5.0000 mg | ORAL_TABLET | Freq: Every day | ORAL | 1 refills | Status: DC

## 2018-08-15 ENCOUNTER — Ambulatory Visit (INDEPENDENT_AMBULATORY_CARE_PROVIDER_SITE_OTHER): Payer: Medicare Other | Admitting: Physician Assistant

## 2018-08-15 ENCOUNTER — Encounter: Payer: Self-pay | Admitting: Physician Assistant

## 2018-08-15 VITALS — BP 120/56 | HR 81 | Ht 66.0 in | Wt 145.0 lb

## 2018-08-15 DIAGNOSIS — I10 Essential (primary) hypertension: Secondary | ICD-10-CM | POA: Diagnosis not present

## 2018-08-15 DIAGNOSIS — M549 Dorsalgia, unspecified: Secondary | ICD-10-CM

## 2018-08-15 DIAGNOSIS — I959 Hypotension, unspecified: Secondary | ICD-10-CM | POA: Diagnosis not present

## 2018-08-15 DIAGNOSIS — R296 Repeated falls: Secondary | ICD-10-CM

## 2018-08-15 DIAGNOSIS — G3184 Mild cognitive impairment, so stated: Secondary | ICD-10-CM

## 2018-08-15 DIAGNOSIS — R413 Other amnesia: Secondary | ICD-10-CM

## 2018-08-15 NOTE — Progress Notes (Signed)
Subjective:    Patient ID: Madison Rollins, female    DOB: 02-01-1928, 84 y.o.   MRN: [id number]  HPI  Patient is a 84 year old female with hypertension, aortic regurgitation, hypothyroidism, COPD, with history of recurrent falls.  She presents to the clinic for follow-up.  Overall patient is doing very well.  Her last visit was a follow-up after fall.  She was in some pain then that has since resolved.  She did get a good back brace which helps gives her some support and helps decrease overall pain.  She is doing bit better at night not wondering.  She continues to eat well but she does not like to drink water. Her BP medication was decreased at last visit and then all together stopped. She is not checking her BP.   She is accompanied by her daughter and granddaughter in [employer].  They are her caregivers feel like she is also doing well.  Her Aricept was decreased due to GI side effects.  She is only taking 5 mg and that seems to help her diarrhea.  They have not seen a significant mental status change.  .. Active Ambulatory Problems    Diagnosis Date Noted  . Acute right-sided low back pain without sciatica 11/16/2016  . Right hip pain 11/16/2016  . Fall 11/16/2016  . COPD (chronic obstructive pulmonary disease) with chronic bronchitis ([place]) 12/17/2016  . Memory changes 12/17/2016  . Mild cognitive impairment 12/18/2016  . Right inguinal hernia 01/14/2017  . History of UTI 01/14/2017  . Right lower quadrant abdominal pain 01/14/2017  . Confusion 04/04/2017  . Systolic ejection murmur 83/15/1761  . Hypothyroidism 07/06/2017  . History of recurrent UTIs 07/06/2017  . Sundowning 07/08/2017  . Aortic regurgitation 08/03/2017  . Irritable 10/10/2017  . Palpitations 11/09/2017  . Candidal intertrigo 12/27/2017  . Lower leg edema 12/27/2017  . Depressed mood 03/17/2018  . Diarrhea 03/17/2018  . HTN (hypertension) 04/27/2018  . Constipation 05/08/2018  . Mid back pain 05/08/2018  .  Syncope and collapse 05/18/2018  . Trouble in sleeping 07/28/2018  . Dementia ([place]) 07/31/2018  . Closed compression fracture of sacrum ([place]) 07/31/2018   Resolved Ambulatory Problems    Diagnosis Date Noted  . Pleural effusion on left 12/01/2016  . Right flank pain 01/14/2017   Past Medical History:  Diagnosis Date  . Cancer ([place])   . Frequency of urination   . Hyperlipidemia   . Hypertension   . Thyroid disease       Review of Systems  All other systems reviewed and are negative.      Objective:   Physical Exam  Constitutional: She is oriented to person, place, and time. She appears well-developed and well-nourished.  HENT:  Head: Normocephalic and atraumatic.  Cardiovascular: Normal rate and regular rhythm.  Murmur heard. Pulmonary/Chest: Effort normal and breath sounds normal.  Neurological: She is alert and oriented to person, place, and time.  Psychiatric: She has a normal mood and affect. Her behavior is normal.          Assessment & Plan:  .Diagnoses and all orders for this visit:  Hypotension, unspecified hypotension type  Recurrent falls  Essential hypertension  Mid back pain   BP looks better but still on the low side. Pt is off all BP medication. Stay off and make sure staying hydrated.   Keep pathways in the home clear. Keep walker very assessable. Keep monitor in room to keep watch on her.   Back pain better.  Continue to use conservative ways to manage pain. Back brace to wear for stability.   Made change to aricept 5mg .   Follow up in 3 months.

## 2018-08-18 ENCOUNTER — Encounter: Payer: Self-pay | Admitting: Physician Assistant

## 2018-08-18 DIAGNOSIS — R296 Repeated falls: Secondary | ICD-10-CM | POA: Insufficient documentation

## 2018-08-18 DIAGNOSIS — I959 Hypotension, unspecified: Secondary | ICD-10-CM | POA: Insufficient documentation

## 2018-08-18 MED ORDER — DONEPEZIL HCL 10 MG PO TABS: 5.0000 mg | ORAL_TABLET | Freq: Every day | ORAL | 1 refills | Status: DC

## 2018-08-28 ENCOUNTER — Telehealth: Payer: Self-pay | Admitting: Physician Assistant

## 2018-08-28 NOTE — Telephone Encounter (Signed)
Family members have called requesting referral for long term facility.

## 2018-08-29 NOTE — Telephone Encounter (Signed)
Ok no referral needs to be made. Once they find a place where they think would be a good fit they get an FL-2 form from the facility and drop it off and I will fill it out.

## 2018-08-29 NOTE — Telephone Encounter (Signed)
Left message for return call.

## 2018-09-04 NOTE — Telephone Encounter (Signed)
Left message for family with provider advice, advise to call back if any questions.

## 2018-09-12 ENCOUNTER — Encounter: Payer: Self-pay | Admitting: Physician Assistant

## 2018-09-12 ENCOUNTER — Ambulatory Visit (INDEPENDENT_AMBULATORY_CARE_PROVIDER_SITE_OTHER): Payer: Medicare Other | Admitting: Physician Assistant

## 2018-09-12 VITALS — BP 128/58 | HR 61 | Temp 97.9°F | Wt 139.0 lb

## 2018-09-12 DIAGNOSIS — N3001 Acute cystitis with hematuria: Secondary | ICD-10-CM | POA: Diagnosis not present

## 2018-09-12 DIAGNOSIS — Z87440 Personal history of urinary (tract) infections: Secondary | ICD-10-CM | POA: Diagnosis not present

## 2018-09-12 DIAGNOSIS — R531 Weakness: Secondary | ICD-10-CM | POA: Diagnosis not present

## 2018-09-12 LAB — POCT URINALYSIS DIPSTICK
Bilirubin, UA: NEGATIVE
Glucose, UA: NEGATIVE
KETONES UA: NEGATIVE
PH UA: 6 (ref 5.0–8.0)
PROTEIN UA: POSITIVE — AB
UROBILINOGEN UA: 0.2 U/dL

## 2018-09-12 MED ORDER — CEFUROXIME AXETIL 250 MG PO TABS: 250.0000 mg | ORAL_TABLET | Freq: Two times a day (BID) | ORAL | 0 refills | Status: AC

## 2018-09-12 NOTE — Patient Instructions (Signed)

## 2018-09-12 NOTE — Progress Notes (Signed)
HPI:                                                                Madison Rollins is a 84 y.o. female who presents to [name] today for weakness and fatigue  History is provided by patient's granddaughter/POA.   Family reports patient has been sleeping more than usual (16-18 hours/day) and complaining of feeling tired. She seems more confused and has been physically weaker, struggling to get out bed most days. She lives at home with her granddaughter.  Patient states she feels "fine, a little tired." Denies dysuria, hematuria, abdominal pain, flank pain. She is incontinence of urine and has a history of recurrent UTI and bladder sling.   Past Medical History:  Diagnosis Date  . Cancer ([place])   . Dementia ([place])   . Frequency of urination   . Hyperlipidemia   . Hypertension   . Thyroid disease    Past Surgical History:  Procedure Laterality Date  . BACK SURGERY    . BLADDER SURGERY    . bladder tacked  2010   Social History   Tobacco Use  . Smoking status: Never Smoker  . Smokeless tobacco: Never Used  Substance Use Topics  . Alcohol use: No   family history includes Cancer in her father and sister; Diabetes in her brother; Hyperlipidemia in her mother; Hypertension in her mother.    ROS: negative except as noted in the HPI  Medications: Current Outpatient Medications  Medication Sig Dispense Refill  . albuterol (PROVENTIL HFA;VENTOLIN HFA) 108 (90 Base) MCG/ACT inhaler Inhale 2 puffs into the lungs every 6 (six) hours as needed for wheezing or shortness of breath. 1 Inhaler 2  . AMBULATORY NON FORMULARY MEDICATION Walker or rolling walker use as needed for falls and gait.  Disp 1 S36.00P Q33.AQTM 1 application 0  . AMBULATORY NON FORMULARY MEDICATION Lumbar back support brace AU:[id number] compression fracture of sacrum  S32.10XA 1 Product 0  . cefUROXime (CEFTIN) 250 MG tablet Take 1 tablet (250 mg total) by mouth 2  (two) times daily with a meal for 7 days. 14 tablet 0  . clotrimazole-betamethasone (LOTRISONE) cream Apply 1 application topically 2 (two) times daily. 45 g 1  . diazepam (VALIUM) 5 MG tablet Take 1 tablet (5 mg total) by mouth at bedtime. Or in the evening for irritation. 30 tablet 1  . diclofenac sodium (VOLTAREN) 1 % GEL Apply 4 g topically 4 (four) times daily. To affected joint. 100 g 2  . diphenoxylate-atropine (LOMOTIL) 2.5-0.025 MG tablet Take 1 tablet by mouth 4 (four) times daily as needed for diarrhea or loose stools. 30 tablet 0  . donepezil (ARICEPT) 10 MG tablet Take 0.5 tablets (5 mg total) by mouth at bedtime. 90 tablet 1  . memantine (NAMENDA) 5 MG tablet TAKE 1 TABLET(5 MG) BY MOUTH DAILY 90 tablet 1  . montelukast (SINGULAIR) 10 MG tablet Take 1 tablet (10 mg total) by mouth at bedtime. 90 tablet 3  . sertraline (ZOLOFT) 50 MG tablet Take 1 and 1/2 tablet daily for mood. 135 tablet 1  . solifenacin (VESICARE) 5 MG tablet TAKE 1 TABLET(5 MG) BY MOUTH DAILY 90 tablet 1  . SYNTHROID 100 MCG tablet TAKE 1 TABLET  BY MOUTH EVERY DAY BEFORE BREAKFAST 90 tablet 1  . traMADol (ULTRAM) 50 MG tablet Take one to two tablets every 4-6hrs as needed for pain. 40 tablet 0  . Vitamin D, Ergocalciferol, (DRISDOL) 50000 units CAPS capsule Take 1 capsule (50,000 Units total) by mouth every 7 (seven) days. 12 capsule 5   Current Facility-Administered Medications  Medication Dose Route Frequency Provider Last Rate Last Dose  . albuterol (PROVENTIL) (2.5 MG/3ML) 0.083% nebulizer solution 2.5 mg  2.5 mg Nebulization Once Breeback, Jade L, PA-C       Allergies  Allergen Reactions  . Hydrocodone-Acetaminophen Other (See Comments)    Hallucinations  "I saw lil youngins running around"       Objective:  There were no vitals taken for this visit. Gen:  alert, not ill-appearing, no distress, appropriate for age 63: Normal work of breathing, normal phonation, clear to auscultation bilaterally,  no wheezes, rales or rhonchi CV: Normal rate, regular rhythm, s1 and s2 distinct, no murmurs, clicks or rubs  GI: abdomen soft, non-tender, no CVA tenderness Neuro: alert and oriented x 3, no tremor MSK: extremities atraumatic, normal gait and station Skin: intact, no rashes on exposed skin   No results found for this or any previous visit (from the past 72 hour(s)). No results found.    Assessment and Plan: 84 y.o. female with   .Diagnoses and all orders for this visit:  Acute cystitis with hematuria -     POCT Urinalysis Dipstick -     Urine Culture -     cefUROXime (CEFTIN) 250 MG tablet; Take 1 tablet (250 mg total) by mouth 2 (two) times daily with a meal for 7 days.  Generalized weakness    Patient afebrile, no tachypnea, no tachycardia, BP normotensive, no CVA tenderness, she is alert and oriented x 3 Foul smelling urine positive for nitrates and large leukocytes Urine cx from 05/02/18 positive for 100,000 colonies pansensitive E. Coli  Treating empirically for uncomplicated cystitis with Ceftin Urine cx pending  Patient education and anticipatory guidance given Patient/POA agrees with treatment plan Follow-up as needed if symptoms worsen or fail to improve  Darlyne Russian PA-C

## 2018-09-14 LAB — URINE CULTURE
MICRO NUMBER: 91477903
SPECIMEN QUALITY: ADEQUATE

## 2018-09-17 ENCOUNTER — Encounter: Payer: Self-pay | Admitting: Physician Assistant

## 2018-10-06 ENCOUNTER — Ambulatory Visit (INDEPENDENT_AMBULATORY_CARE_PROVIDER_SITE_OTHER): Payer: Medicare Other | Admitting: Physician Assistant

## 2018-10-06 ENCOUNTER — Encounter: Payer: Self-pay | Admitting: Physician Assistant

## 2018-10-06 VITALS — BP 135/72 | HR 63 | Temp 97.7°F | Ht 66.0 in | Wt 138.0 lb

## 2018-10-06 DIAGNOSIS — L821 Other seborrheic keratosis: Secondary | ICD-10-CM | POA: Diagnosis not present

## 2018-10-06 DIAGNOSIS — F329 Major depressive disorder, single episode, unspecified: Secondary | ICD-10-CM

## 2018-10-06 DIAGNOSIS — N390 Urinary tract infection, site not specified: Secondary | ICD-10-CM | POA: Diagnosis not present

## 2018-10-06 DIAGNOSIS — R29898 Other symptoms and signs involving the musculoskeletal system: Secondary | ICD-10-CM

## 2018-10-06 DIAGNOSIS — N3001 Acute cystitis with hematuria: Secondary | ICD-10-CM

## 2018-10-06 DIAGNOSIS — F05 Delirium due to known physiological condition: Secondary | ICD-10-CM

## 2018-10-06 DIAGNOSIS — R4589 Other symptoms and signs involving emotional state: Secondary | ICD-10-CM

## 2018-10-06 DIAGNOSIS — G3184 Mild cognitive impairment, so stated: Secondary | ICD-10-CM

## 2018-10-06 DIAGNOSIS — J449 Chronic obstructive pulmonary disease, unspecified: Secondary | ICD-10-CM

## 2018-10-06 LAB — POCT URINALYSIS DIPSTICK
Bilirubin, UA: NEGATIVE
Blood, UA: NEGATIVE
Glucose, UA: NEGATIVE
Ketones, UA: NEGATIVE
Nitrite, UA: NEGATIVE
Protein, UA: NEGATIVE
Spec Grav, UA: 1.025
Urobilinogen, UA: 0.2 U/dL
pH, UA: 6.5

## 2018-10-06 NOTE — Patient Instructions (Signed)
Will make referral for dermatology.    Seborrheic Keratosis A seborrheic keratosis is a common, noncancerous (benign) skin growth. These growths are velvety, waxy, rough, tan, brown, or black spots that appear on the skin. These skin growths can be flat or raised, and scaly. What are the causes? The cause of this condition is not known. What increases the risk? You are more likely to develop this condition if you:  Have a family history of seborrheic keratosis.  Are 50 or older.  Are pregnant.  Have had estrogen replacement therapy. What are the signs or symptoms? Symptoms of this condition include growths on the face, chest, shoulders, back, or other areas. These growths:  Are usually painless, but may become irritated and itchy.  Can be yellow, brown, black, or other colors.  Are slightly raised or have a flat surface.  Are sometimes rough or wart-like in texture.  Are often velvety or waxy on the surface.  Are round or oval-shaped.  Often occur in groups, but may occur as a single growth. How is this diagnosed? This condition is diagnosed with a medical history and physical exam.  A sample of the growth may be tested (skin biopsy).  You may need to see a skin specialist (dermatologist). How is this treated? Treatment is not usually needed for this condition, unless the growths are irritated or bleed often.  You may also choose to have the growths removed if you do not like their appearance. ? Most commonly, these growths are treated with a procedure in which liquid nitrogen is applied to "freeze" off the growth (cryosurgery). ? They may also be burned off with electricity (electrocautery) or removed by scraping (curettage). Follow these instructions at home:  Watch your growth for any changes.  Keep all follow-up visits as told by your health care provider. This is important.  Do not scratch or pick at the growth or growths. This can cause them to become  irritated or infected. Contact a health care provider if:  You suddenly have many new growths.  Your growth bleeds, itches, or hurts.  Your growth suddenly becomes larger or changes color. Summary  A seborrheic keratosis is a common, noncancerous (benign) skin growth.  Treatment is not usually needed for this condition, unless the growths are irritated or bleed often.  Watch your growth for any changes.  Contact a health care provider if you suddenly have many new growths or your growth suddenly becomes larger or changes color.  Keep all follow-up visits as told by your health care provider. This is important. This information is not intended to replace advice given to you by your health care provider. Make sure you discuss any questions you have with your health care provider. Document Released: 10/23/2010 Document Revised: 02/02/2018 Document Reviewed: 02/02/2018 Elsevier Interactive Patient Education  2019 [employer].

## 2018-10-06 NOTE — Progress Notes (Signed)
Subjective:    Patient ID: Madison Rollins, female    DOB: 03-21-1928, 85 y.o.   MRN: [id number]  HPI  Pt is a 85 yo female with mild cognitive impairment, sundowning,  recurrent UTI's, COPD  high fall risk who presents to the clinic to follow up after UTI on 12/10. She feels like symptoms have completely resolved. She is here to make sure. She denies any SOB, edema, abdominal or flank pain. No fever, chills, body aches.   She is accompanied by grandson who is power of attorney and daughter.   Family would like FL2 filled out for skilled nursing. They feel like they can no longer take care of her at home 24 hours a day. She frequently wakes at night and has fallen multiple times. Her lower extremity is weak and uses a walker.   Daughter is worried about a growing growth on her left eyelid. She wonders what we can do about it. Present for a long time but recently growing.   .. Active Ambulatory Problems    Diagnosis Date Noted  . Acute right-sided low back pain without sciatica 11/16/2016  . Right hip pain 11/16/2016  . Fall 11/16/2016  . COPD (chronic obstructive pulmonary disease) with chronic bronchitis ([place]) 12/17/2016  . Memory changes 12/17/2016  . Mild cognitive impairment 12/18/2016  . Right inguinal hernia 01/14/2017  . History of UTI 01/14/2017  . Right lower quadrant abdominal pain 01/14/2017  . Confusion 04/04/2017  . Systolic ejection murmur 88/50/2774  . Hypothyroidism 07/06/2017  . History of recurrent UTIs 07/06/2017  . Sundowning 07/08/2017  . Aortic regurgitation 08/03/2017  . Irritable 10/10/2017  . Palpitations 11/09/2017  . Candidal intertrigo 12/27/2017  . Lower leg edema 12/27/2017  . Depressed mood 03/17/2018  . Diarrhea 03/17/2018  . HTN (hypertension) 04/27/2018  . Constipation 05/08/2018  . Mid back pain 05/08/2018  . Syncope and collapse 05/18/2018  . Trouble in sleeping 07/28/2018  . Dementia ([place]) 07/31/2018  . Closed compression fracture of  sacrum ([place]) 07/31/2018  . Recurrent falls 08/18/2018  . Hypotension 08/18/2018   Resolved Ambulatory Problems    Diagnosis Date Noted  . Pleural effusion on left 12/01/2016  . Right flank pain 01/14/2017   Past Medical History:  Diagnosis Date  . Cancer ([place])   . Frequency of urination   . Hyperlipidemia   . Hypertension   . Thyroid disease        Review of Systems See HPI.     Objective:   Physical Exam Vitals signs reviewed.  Constitutional:      Appearance: Normal appearance.  HENT:     Head: Normocephalic and atraumatic.  Eyes:     Comments: hyperkerotic scaly growth on left upper eyelid.   Cardiovascular:     Rate and Rhythm: Normal rate and regular rhythm.  Pulmonary:     Effort: Pulmonary effort is normal.     Breath sounds: Normal breath sounds.  Abdominal:     General: Bowel sounds are normal. There is no distension.     Palpations: Abdomen is soft.     Tenderness: There is no right CVA tenderness or left CVA tenderness.  Neurological:     General: No focal deficit present.     Mental Status: She is alert and oriented to person, place, and time.  Psychiatric:        Mood and Affect: Mood normal.        Behavior: Behavior normal.  Assessment & Plan:  Marland KitchenMarland KitchenPearl was seen today for follow-up.  Diagnoses and all orders for this visit:  Acute cystitis with hematuria -     POCT Urinalysis Dipstick -     Urine Culture  Seborrheic keratoses  Mild cognitive impairment  Frequent UTI  Weakness of both lower extremities  Sundowning  Depressed mood  COPD (chronic obstructive pulmonary disease) with chronic bronchitis ([place])  Other orders -     Extra Urine Specimen   .Marland Kitchen Results for orders placed or performed in visit on 10/06/18  Urine Culture  Result Value Ref Range   MICRO NUMBER: [id number]    SPECIMEN QUALITY: Adequate    Sample Source NOT GIVEN    STATUS: FINAL    Result: No Growth   Extra Urine Specimen  Result Value Ref  Range   Extra Urine Specimen    POCT Urinalysis Dipstick  Result Value Ref Range   Color, UA Yellow    Clarity, UA Clear    Glucose, UA Negative Negative   Bilirubin, UA Negative    Ketones, UA Negative    Spec Grav, UA 1.025 1.010 - 1.025   Blood, UA Negative    pH, UA 6.5 5.0 - 8.0   Protein, UA Negative Negative   Urobilinogen, UA 0.2 0.2 or 1.0 E.U./dL   Nitrite, UA Negative    Leukocytes, UA Small (1+) (A) Negative   Appearance     Odor     Will culture to confirm UTI cleared.   Growth on eyelid appears to be seb keratosis and benign. I would use cryotherapy but concerned about eyelid location. Will send to dermatology.   Filled out FL2. Will fax.   Spent time discussing decision to move to skilled nursing facility.   Marland Kitchen.Spent 30 minutes with patient and greater than 50 percent of visit spent counseling patient regarding treatment plan.

## 2018-10-07 NOTE — Progress Notes (Signed)
Wanted urine culture.

## 2018-10-08 LAB — URINE CULTURE
MICRO NUMBER: 10996
Result:: NO GROWTH
SPECIMEN QUALITY:: ADEQUATE

## 2018-10-08 LAB — EXTRA URINE SPECIMEN

## 2018-10-08 NOTE — Progress Notes (Signed)
Call pt: no bacteria growth on culture. UtI has fully resolved.

## 2018-10-09 DIAGNOSIS — N390 Urinary tract infection, site not specified: Secondary | ICD-10-CM | POA: Insufficient documentation

## 2018-10-09 DIAGNOSIS — R29898 Other symptoms and signs involving the musculoskeletal system: Secondary | ICD-10-CM | POA: Insufficient documentation

## 2018-10-18 ENCOUNTER — Other Ambulatory Visit: Payer: Self-pay | Admitting: Physician Assistant

## 2018-10-18 DIAGNOSIS — G3184 Mild cognitive impairment, so stated: Secondary | ICD-10-CM

## 2018-10-18 DIAGNOSIS — R413 Other amnesia: Secondary | ICD-10-CM

## 2018-11-07 ENCOUNTER — Other Ambulatory Visit: Payer: Self-pay | Admitting: Physician Assistant

## 2018-11-07 DIAGNOSIS — E039 Hypothyroidism, unspecified: Secondary | ICD-10-CM

## 2018-11-07 DIAGNOSIS — R4589 Other symptoms and signs involving emotional state: Secondary | ICD-10-CM

## 2018-11-07 DIAGNOSIS — F05 Delirium due to known physiological condition: Secondary | ICD-10-CM

## 2018-11-07 DIAGNOSIS — F329 Major depressive disorder, single episode, unspecified: Secondary | ICD-10-CM

## 2019-05-22 IMAGING — DX DG LUMBAR SPINE COMPLETE 4+V
5 series · 5 of 5 positions shown · non-contrast
Comparison: None.

CLINICAL DATA: The patient fell [REDACTED] and landed on her back.
The patient complains of right lower posterior rib cage pain and
right lateral low back pain.

EXAM:
LUMBAR SPINE - COMPLETE 4+ VIEW

[l-spine ap]
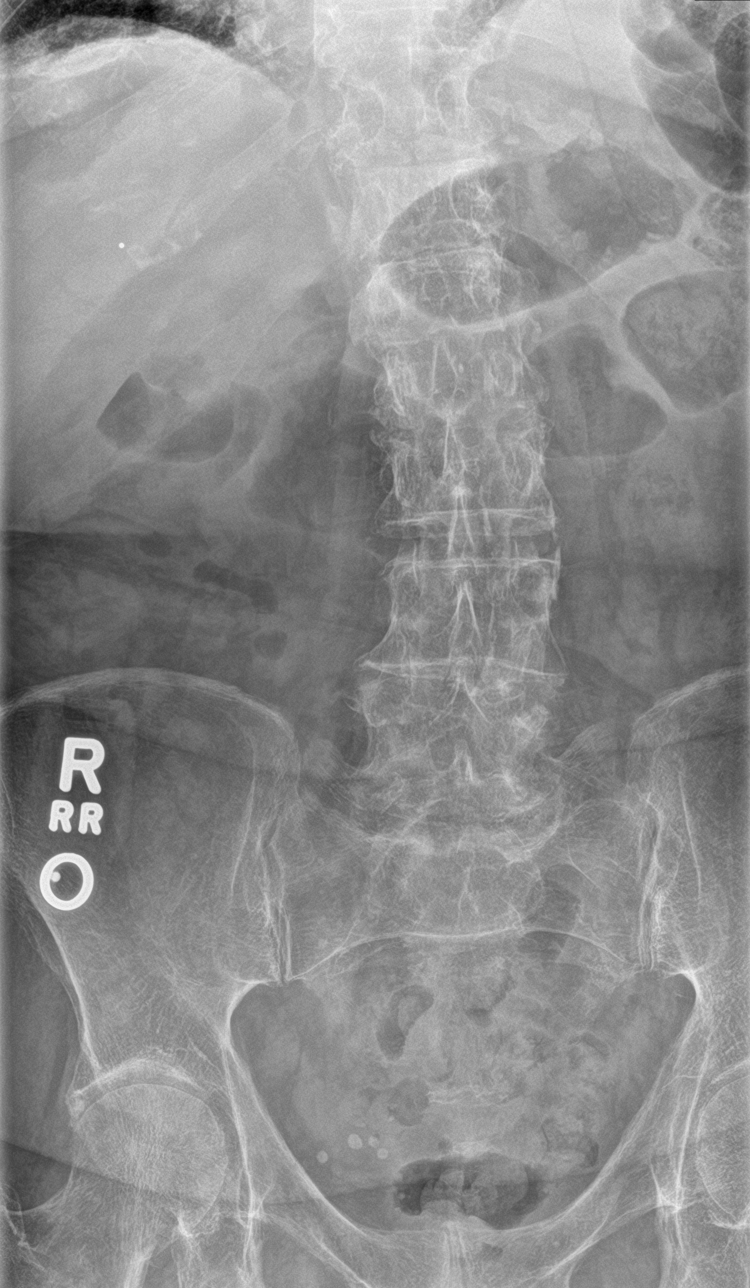

[l-spine obl (1 of 2)]
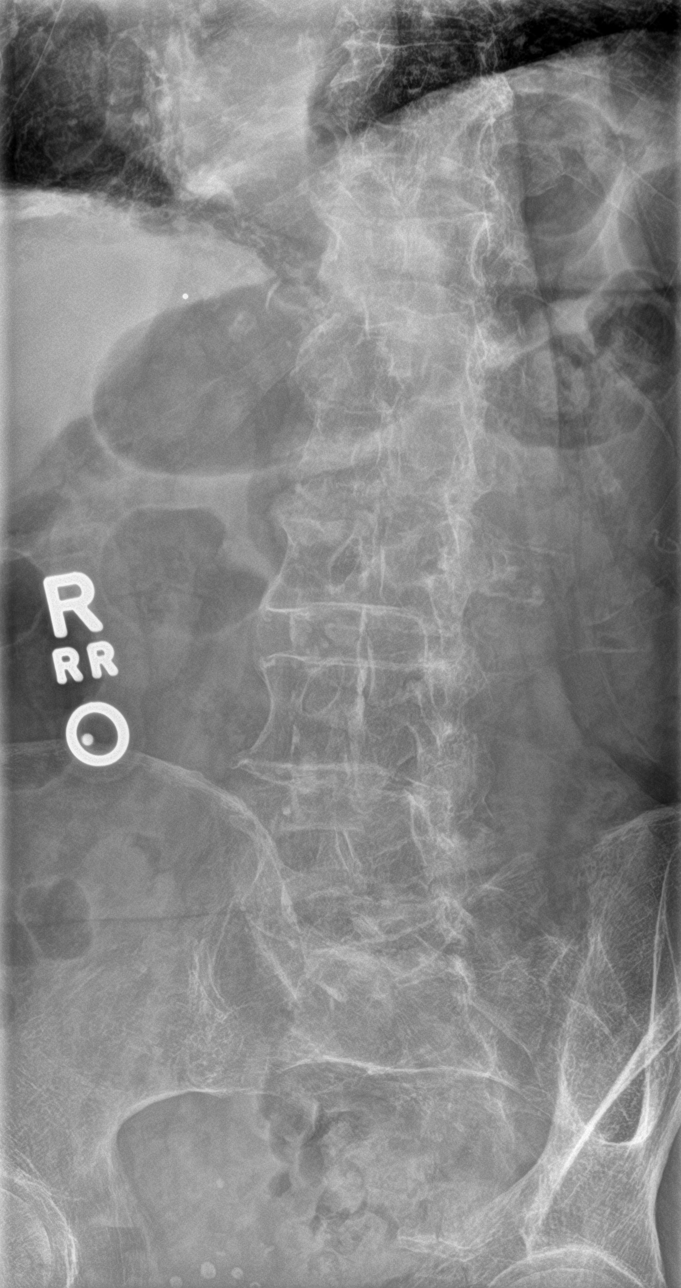

[l-spine obl (2 of 2)]
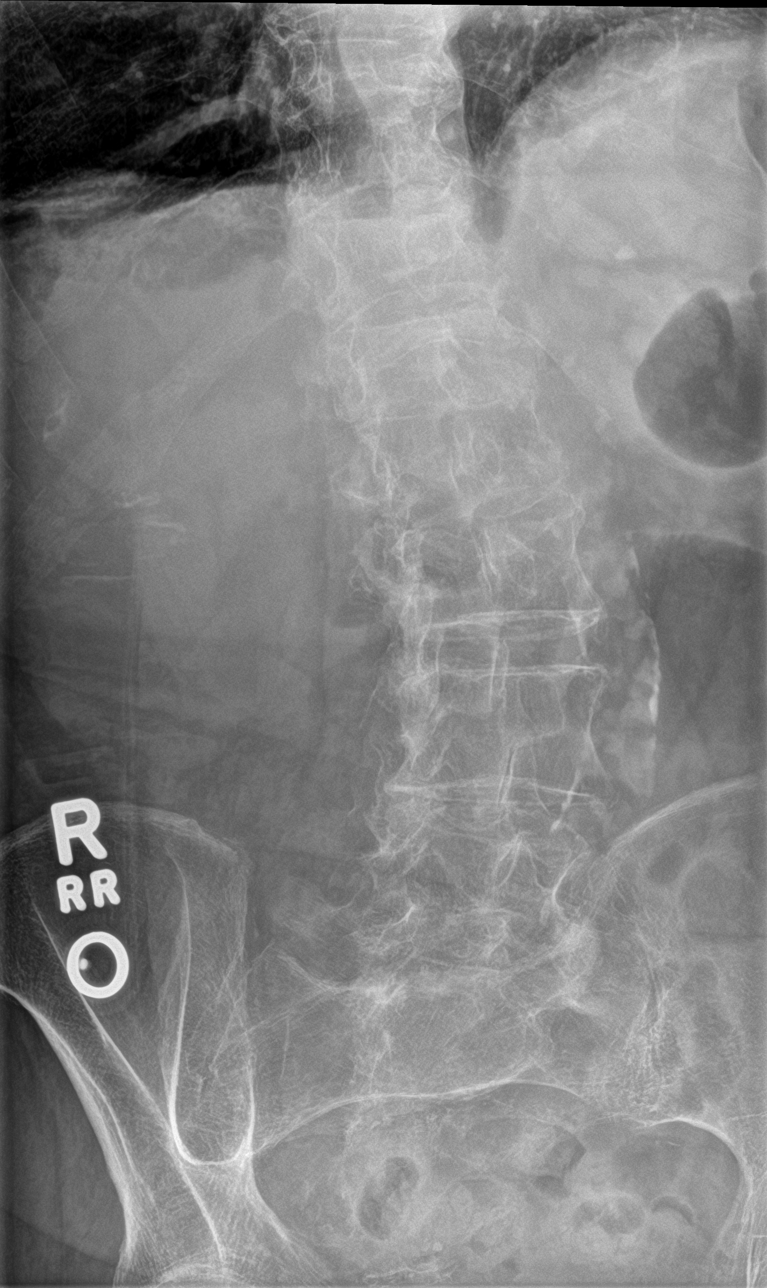

[l-spine lat]
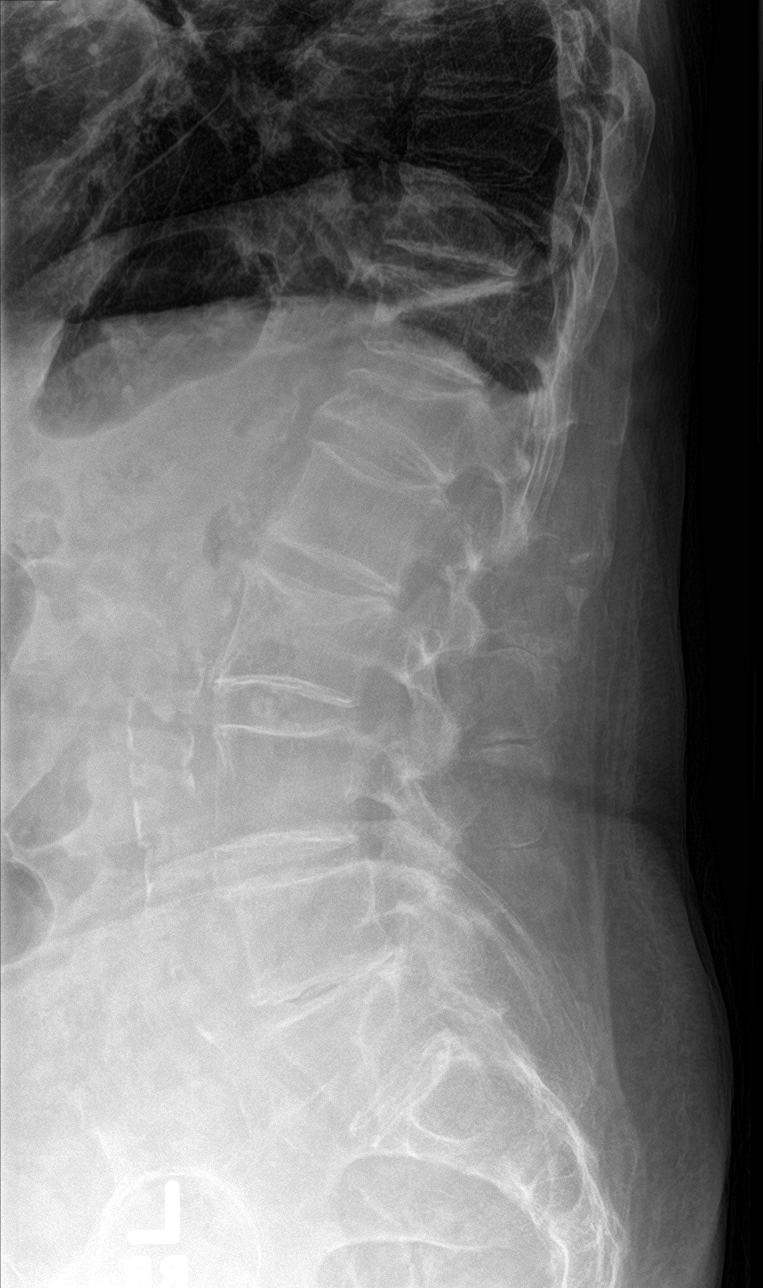

[l-spine spot]
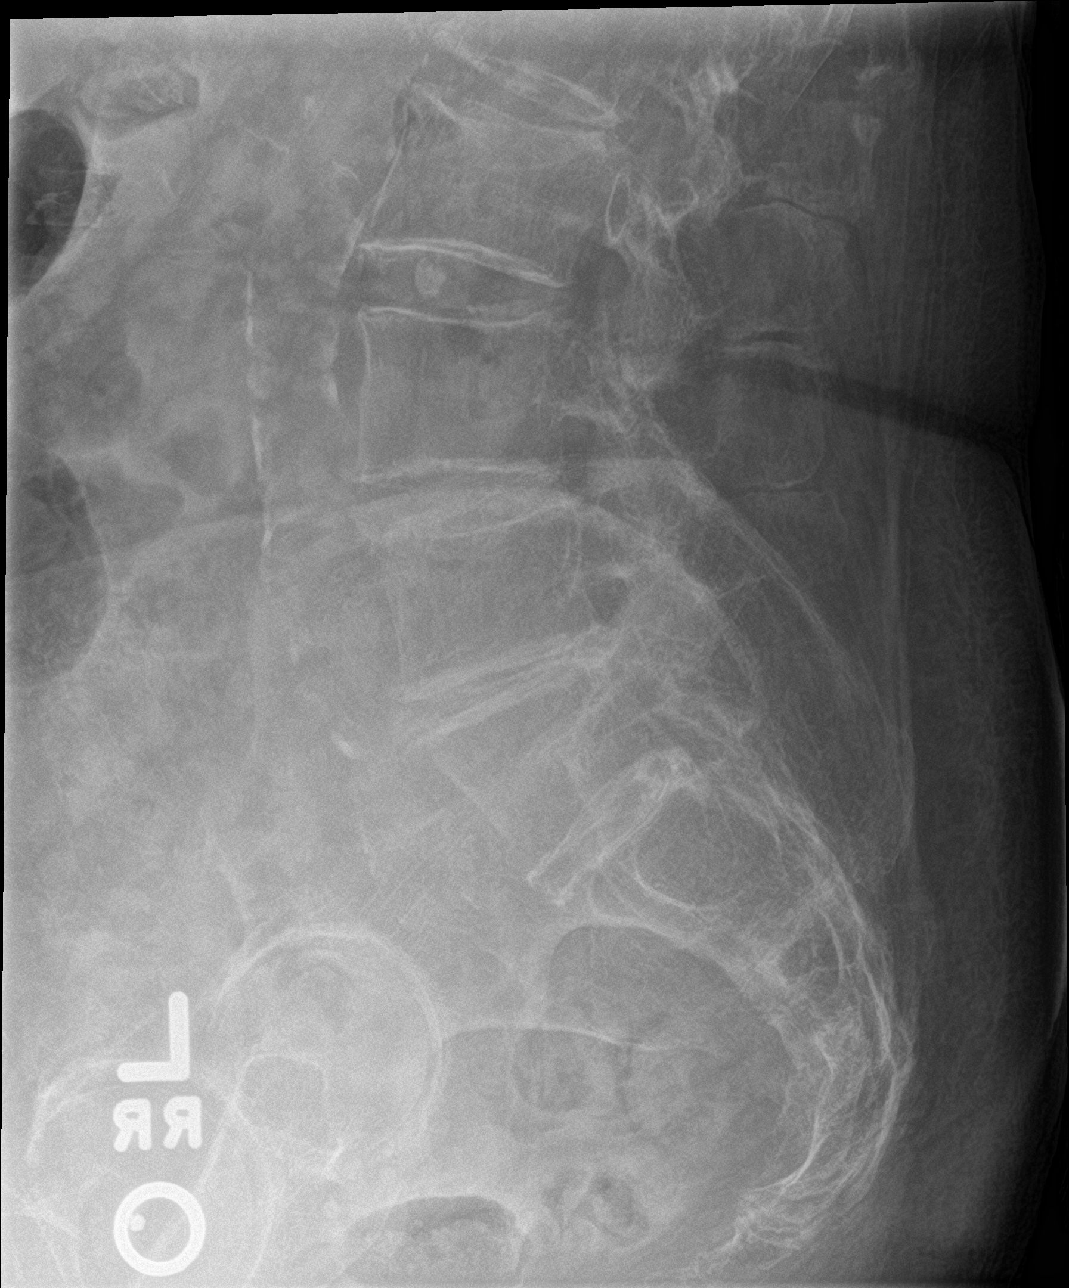

[5 of 5 positions shown; findings below may reference images not displayed]

FINDINGS: S1 is considered transitional. The lumbar vertebral bodies are
subjectively osteopenic. There is compression of the body of L1 with
loss of height anteriorly of 50% and posteriorly 15%. There is
superior endplate depression of L3. There is no spondylolisthesis.
The pedicles and transverse processes are grossly intact. Mild S
shaped thoracolumbar scoliosis.
IMPRESSION: Transitional S1 vertebral level. Compression fracture of the body of
S1 of uncertain age as described. Mild superior endplate depression
of L3.

## 2020-11-10 ENCOUNTER — Telehealth: Payer: Self-pay | Admitting: General Practice

## 2020-12-12 NOTE — Telephone Encounter (Signed)
Documentation only.
